# Patient Record
Sex: Female | Born: 1955 | Race: White | Hispanic: No | Marital: Married | Smoking: Never smoker
Health system: Southern US, Community
[De-identification: ages and names within clinical notes are randomized; demographics above are authoritative.]

## PROBLEM LIST (undated history)

## (undated) ENCOUNTER — Emergency Department (HOSPITAL_COMMUNITY): Discharge status: Absent without leave | Payer: Self-pay

## (undated) DIAGNOSIS — E785 Hyperlipidemia, unspecified: Secondary | ICD-10-CM

## (undated) DIAGNOSIS — D279 Benign neoplasm of unspecified ovary: Secondary | ICD-10-CM

## (undated) DIAGNOSIS — F419 Anxiety disorder, unspecified: Secondary | ICD-10-CM

## (undated) DIAGNOSIS — E213 Hyperparathyroidism, unspecified: Secondary | ICD-10-CM

## (undated) DIAGNOSIS — I10 Essential (primary) hypertension: Secondary | ICD-10-CM

## (undated) DIAGNOSIS — K219 Gastro-esophageal reflux disease without esophagitis: Secondary | ICD-10-CM

## (undated) HISTORY — PX: ABDOMINAL HYSTERECTOMY: SHX81

---

## 2004-06-29 ENCOUNTER — Ambulatory Visit: Payer: Self-pay | Admitting: Unknown Physician Specialty

## 2005-08-30 ENCOUNTER — Ambulatory Visit: Payer: Self-pay | Admitting: Unknown Physician Specialty

## 2006-01-17 ENCOUNTER — Ambulatory Visit: Payer: Self-pay

## 2006-08-05 ENCOUNTER — Emergency Department: Payer: Self-pay | Admitting: Emergency Medicine

## 2006-09-26 ENCOUNTER — Ambulatory Visit: Payer: Self-pay | Admitting: Unknown Physician Specialty

## 2007-09-30 ENCOUNTER — Ambulatory Visit: Payer: Self-pay | Admitting: Unknown Physician Specialty

## 2008-08-24 ENCOUNTER — Ambulatory Visit: Payer: Self-pay | Admitting: Gastroenterology

## 2008-10-05 ENCOUNTER — Ambulatory Visit: Payer: Self-pay | Admitting: Unknown Physician Specialty

## 2009-10-11 ENCOUNTER — Ambulatory Visit: Payer: Self-pay | Admitting: Unknown Physician Specialty

## 2010-10-31 ENCOUNTER — Ambulatory Visit: Payer: Self-pay | Admitting: Unknown Physician Specialty

## 2011-12-28 ENCOUNTER — Ambulatory Visit: Payer: Self-pay | Admitting: Unknown Physician Specialty

## 2016-02-08 ENCOUNTER — Other Ambulatory Visit: Payer: Self-pay | Admitting: Gastroenterology

## 2016-02-08 DIAGNOSIS — R1310 Dysphagia, unspecified: Secondary | ICD-10-CM

## 2016-02-17 ENCOUNTER — Ambulatory Visit
Admission: RE | Admit: 2016-02-17 | Discharge: 2016-02-17 | Disposition: A | Discharge status: Home / Self Care | Payer: Managed Care, Other (non HMO) | Source: Ambulatory Visit | Attending: Gastroenterology | Admitting: Gastroenterology

## 2016-02-17 DIAGNOSIS — K222 Esophageal obstruction: Secondary | ICD-10-CM | POA: Diagnosis not present

## 2016-02-17 DIAGNOSIS — R1310 Dysphagia, unspecified: Secondary | ICD-10-CM | POA: Diagnosis present

## 2016-02-17 DIAGNOSIS — K219 Gastro-esophageal reflux disease without esophagitis: Secondary | ICD-10-CM | POA: Insufficient documentation

## 2016-02-27 ENCOUNTER — Encounter: Payer: Self-pay | Admitting: *Deleted

## 2016-02-29 ENCOUNTER — Encounter
Admission: RE | Disposition: A | Discharge status: Home / Self Care | Payer: Self-pay | Source: Ambulatory Visit | Attending: Gastroenterology

## 2016-02-29 ENCOUNTER — Encounter: Payer: Self-pay | Admitting: Anesthesiology

## 2016-02-29 ENCOUNTER — Ambulatory Visit: Payer: Managed Care, Other (non HMO) | Admitting: Anesthesiology

## 2016-02-29 ENCOUNTER — Ambulatory Visit
Admission: RE | Admit: 2016-02-29 | Discharge: 2016-02-29 | Disposition: A | Discharge status: Home / Self Care | Payer: Managed Care, Other (non HMO) | Source: Ambulatory Visit | Attending: Gastroenterology | Admitting: Gastroenterology

## 2016-02-29 DIAGNOSIS — K222 Esophageal obstruction: Secondary | ICD-10-CM | POA: Insufficient documentation

## 2016-02-29 DIAGNOSIS — K449 Diaphragmatic hernia without obstruction or gangrene: Secondary | ICD-10-CM | POA: Diagnosis not present

## 2016-02-29 DIAGNOSIS — I10 Essential (primary) hypertension: Secondary | ICD-10-CM | POA: Insufficient documentation

## 2016-02-29 DIAGNOSIS — F419 Anxiety disorder, unspecified: Secondary | ICD-10-CM | POA: Insufficient documentation

## 2016-02-29 DIAGNOSIS — K210 Gastro-esophageal reflux disease with esophagitis: Secondary | ICD-10-CM | POA: Insufficient documentation

## 2016-02-29 DIAGNOSIS — Z79899 Other long term (current) drug therapy: Secondary | ICD-10-CM | POA: Diagnosis not present

## 2016-02-29 DIAGNOSIS — R1310 Dysphagia, unspecified: Secondary | ICD-10-CM | POA: Diagnosis present

## 2016-02-29 HISTORY — DX: Gastro-esophageal reflux disease without esophagitis: K21.9

## 2016-02-29 HISTORY — PX: ESOPHAGOGASTRODUODENOSCOPY (EGD) WITH PROPOFOL: SHX5813

## 2016-02-29 HISTORY — DX: Hyperlipidemia, unspecified: E78.5

## 2016-02-29 HISTORY — DX: Anxiety disorder, unspecified: F41.9

## 2016-02-29 HISTORY — DX: Essential (primary) hypertension: I10

## 2016-02-29 SURGERY — ESOPHAGOGASTRODUODENOSCOPY (EGD) WITH PROPOFOL
Anesthesia: General

## 2016-02-29 MED ORDER — PROPOFOL 500 MG/50ML IV EMUL
INTRAVENOUS | Status: DC | PRN
  Administered 2016-02-29: 150 ug/kg/min via INTRAVENOUS

## 2016-02-29 MED ORDER — SODIUM CHLORIDE 0.9 % IV SOLN
INTRAVENOUS | Status: DC
  Administered 2016-02-29: 1000 mL via INTRAVENOUS

## 2016-02-29 MED ORDER — LIDOCAINE HCL (CARDIAC) 20 MG/ML IV SOLN
INTRAVENOUS | Status: DC | PRN
  Administered 2016-02-29: 60 mg via INTRAVENOUS

## 2016-02-29 MED ORDER — MIDAZOLAM HCL 2 MG/2ML IJ SOLN
INTRAMUSCULAR | Status: DC | PRN
  Administered 2016-02-29: 2 mg via INTRAVENOUS

## 2016-02-29 MED ORDER — PROPOFOL 10 MG/ML IV BOLUS
INTRAVENOUS | Status: DC | PRN
  Administered 2016-02-29: 50 mg via INTRAVENOUS

## 2016-02-29 MED ORDER — ALFENTANIL 500 MCG/ML IJ INJ
INJECTION | INTRAMUSCULAR | Status: DC | PRN
  Administered 2016-02-29: 500 ug via INTRAVENOUS

## 2016-02-29 MED ORDER — SODIUM CHLORIDE 0.9 % IV SOLN: INTRAVENOUS | Status: DC

## 2016-02-29 MED ORDER — PHENYLEPHRINE HCL 10 MG/ML IJ SOLN
INTRAMUSCULAR | Status: DC | PRN
Start: 2016-02-29 — End: 2016-02-29
  Administered 2016-02-29: 200 ug via INTRAVENOUS
  Administered 2016-02-29: 100 ug via INTRAVENOUS

## 2016-02-29 NOTE — Anesthesia Postprocedure Evaluation (Signed)
Anesthesia Post Note  Patient: Roseanne KaufmanJudy I Woodhead  Procedure(s) Performed: Procedure(s): ESOPHAGOGASTRODUODENOSCOPY (EGD) WITH PROPOFOL  Patient location during evaluation: Endoscopy Anesthesia Type: General Level of consciousness: awake and alert Pain management: pain level controlled Vital Signs Assessment: post-procedure vital signs reviewed and stable Respiratory status: spontaneous breathing, nonlabored ventilation, respiratory function stable and patient connected to nasal cannula oxygen Cardiovascular status: blood pressure returned to baseline and stable Postop Assessment: no signs of nausea or vomiting Anesthetic complications: no    Last Vitals:  Filed Vitals:   02/29/16 1533 02/29/16 1543  BP: 108/81 115/79  Pulse: 91 89  Temp:    Resp: 15 16    Last Pain: There were no vitals filed for this visit.               Chaney Ingram S

## 2016-02-29 NOTE — Anesthesia Preprocedure Evaluation (Signed)
Anesthesia Evaluation  Patient identified by MRN, date of birth, ID band Patient awake    Reviewed: Allergy & Precautions, NPO status , Patient's Chart, lab work & pertinent test results, reviewed documented beta blocker date and time   Airway Mallampati: II  TM Distance: >3 FB     Dental  (+) Chipped   Pulmonary           Cardiovascular hypertension, Pt. on medications      Neuro/Psych Anxiety    GI/Hepatic GERD  ,  Endo/Other    Renal/GU      Musculoskeletal   Abdominal   Peds  Hematology   Anesthesia Other Findings   Reproductive/Obstetrics                             Anesthesia Physical Anesthesia Plan  ASA: II  Anesthesia Plan: General   Post-op Pain Management:    Induction:   Airway Management Planned: Nasal Cannula  Additional Equipment:   Intra-op Plan:   Post-operative Plan:   Informed Consent: I have reviewed the patients History and Physical, chart, labs and discussed the procedure including the risks, benefits and alternatives for the proposed anesthesia with the patient or authorized representative who has indicated his/her understanding and acceptance.     Plan Discussed with: CRNA  Anesthesia Plan Comments:         Anesthesia Quick Evaluation

## 2016-02-29 NOTE — Op Note (Signed)
[facility] Gastroenterology Patient Name: Tiffany Good Procedure Date: 02/29/2016 2:45 PM MRN: [id number] Account #: [id number] Date of Birth: 21-Apr-1956 Admit Type: Outpatient Age: 60 Room: [facility] ENDO ROOM 2 Gender: Female Note Status: Finalized Procedure:            Upper GI endoscopy Indications:          Dysphagia Providers:            Christena Deem, MD, Scot Jun, MD Referring MD:         Daniel Nones, MD (Referring MD) Medicines:            Monitored Anesthesia Care Complications:        No immediate complications. Procedure:            Pre-Anesthesia Assessment:                       - ASA Grade Assessment: II - A patient with mild                        systemic disease.                       After obtaining informed consent, the endoscope was                        passed under direct vision. Throughout the procedure,                        the patient's blood pressure, pulse, and oxygen                        saturations were monitored continuously. The Endoscope                        was introduced through the mouth, and advanced to the                        second part of duodenum. The upper GI endoscopy was                        accomplished without difficulty. The patient tolerated                        the procedure well. Findings:      A high-grade of narrowing Schatzki ring (acquired) was found at the       gastroesophageal junction.      LA Grade A (one or more mucosal breaks less than 5 mm, not extending       between tops of 2 mucosal folds) esophagitis with no bleeding was found.       Initially the ring would not allow passage of the scope. Assistance was       obtained from Dr Mechele Collin. Scope was passes into the stomach and on the       the duodenum. A guidewire was placed and the scope was withdrawn.       Dilation was performed with a Savary dilator with mild resistance at 38       Fr and 45 Fr .      The first portion of  the duodenum and second portion of the duodenum  were normal.      A small hiatal hernia was found.      The cardia and gastric fundus were normal on retroflexion otherwise.      The exam of the stomach was otherwise normal. Impression:           - High-grade of narrowing Schatzki ring.                       - LA Grade A erosive esophagitis. Dilated.                       - Normal first portion of the duodenum and second                        portion of the duodenum.                       - Small hiatal hernia.                       - No specimens collected. Recommendation:       - Discharge patient to home.                       - Clear liquid diet today.                       - Full liquid diet for 3 days, then advance as                        tolerated to soft diet for 3 days.                       - Use Protonix (pantoprazole) 40 mg PO daily daily.                       - Repeat upper endoscopy in 8 weeks to check healing                        and for retreatment. Procedure Code(s):    --- Professional ---                       [phone], Esophagogastroduodenoscopy, flexible, transoral;                        with insertion of guide wire followed by passage of                        dilator(s) through esophagus over guide wire Diagnosis Code(s):    --- Professional ---                       K22.2, Esophageal obstruction                       K20.8, Other esophagitis                       K44.9, Diaphragmatic hernia without obstruction or                        gangrene  R13.10, Dysphagia, unspecified CPT copyright 2016 American Medical Association. All rights reserved. The codes documented in this report are preliminary and upon coder review may  be revised to meet current compliance requirements. Christena DeemMartin U Doshie Maggi, MD 02/29/2016 3:12:27 PM This report has been signed electronically. Scot Junobert T Elliott, MD Number of Addenda: 0 Note Initiated On: 02/29/2016  2:45 PM      [facility]

## 2016-02-29 NOTE — Transfer of Care (Signed)
Immediate Anesthesia Transfer of Care Note  Patient: Tiffany Good  Procedure(s) Performed: Procedure(s): ESOPHAGOGASTRODUODENOSCOPY (EGD) WITH PROPOFOL  Patient Location: Endoscopy Unit  Anesthesia Type:General  Level of Consciousness: awake, alert , oriented and patient cooperative  Airway & Oxygen Therapy: Patient Spontanous Breathing and Patient connected to nasal cannula oxygen  Post-op Assessment: Report given to RN, Post -op Vital signs reviewed and stable and Patient moving all extremities X 4  Post vital signs: Reviewed and stable  Last Vitals:  Filed Vitals:   02/29/16 1335  BP: 160/83  Pulse: 116  Temp: 36.9 C  Resp: 18    Last Pain: There were no vitals filed for this visit.       Complications: No apparent anesthesia complications

## 2016-02-29 NOTE — H&P (Signed)
Outpatient short stay form Pre-procedure 02/29/2016 2:33 PM Christena DeemMartin U Skulskie MD  Primary Physician: Dr. Daniel NonesBert Klein  Reason for visit:  EGD  History of present illness:  Patient is a 60 year old female Sunday as above. She has a history of dysphagia that has been going on for several years but worsening over past couple of months. This occurs perhaps 2 or 3 times a week. She does had to regurgitate foods at times. He has been on proton pump inhibitor. She does take ibuprofen and occasionally before bedtime. She does have a history of gastric esophageal reflux. She denies use of any aspirin products or blood thinning agents. 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 Did not restrict passage of liquid barium. There are also evidence of intermittent tertiary contractions probably mild spasm as well as mild gastropathy reflux.    Current facility-administered medications:  .  0.9 %  sodium chloride infusion, , Intravenous, Continuous, Christena DeemMartin U Skulskie, MD .  0.9 %  sodium chloride infusion, , Intravenous, Continuous, Christena DeemMartin U Skulskie, MD, Last Rate: 20 mL/hr at 02/29/16 1409, 1,000 mL at 02/29/16 1409 .  0.9 %  sodium chloride infusion, , Intravenous, Continuous, Christena DeemMartin U Skulskie, MD  Prescriptions prior to admission  Medication Sig Dispense Refill Last Dose  . amLODipine (NORVASC) 5 MG tablet Take 5 mg by mouth daily.   02/29/2016 at 0545  . atorvastatin (LIPITOR) 20 MG tablet Take 20 mg by mouth daily.   02/28/2016 at 1830  . Cholecalciferol (VITAMIN D3) 5000 units TBDP Take 5,000 Units by mouth.     . escitalopram (LEXAPRO) 10 MG tablet Take 10 mg by mouth daily.     . pantoprazole (PROTONIX) 40 MG tablet Take 40 mg by mouth daily.   02/28/2016 at 1830     No Known Allergies   Past Medical History  Diagnosis Date  . Anxiety   . GERD (gastroesophageal reflux disease)    . Hypertension   . Serum lipids high     Review of systems:      Physical Exam    Heart and lungs: Regular rate and rhythm without rub or gallop, lungs are bilaterally clear.    HEENT: Normocephalic atraumatic eyes are anicteric    Other:     Pertinant exam for procedure: Soft nontender nondistended bowel sounds positive normoactive.    Planned proceedures: EGD and indicated procedures. I have discussed the risks benefits and complications of procedures to include not limited to bleeding, infection, perforation and the risk of sedation and the patient wishes to proceed.    Christena DeemMartin U Skulskie, MD Gastroenterology 02/29/2016  2:33 PM

## 2016-03-01 ENCOUNTER — Encounter: Payer: Self-pay | Admitting: Gastroenterology

## 2016-05-16 ENCOUNTER — Encounter: Payer: Self-pay | Admitting: *Deleted

## 2016-05-17 ENCOUNTER — Ambulatory Visit: Payer: Managed Care, Other (non HMO) | Admitting: Anesthesiology

## 2016-05-17 ENCOUNTER — Encounter: Payer: Self-pay | Admitting: *Deleted

## 2016-05-17 ENCOUNTER — Ambulatory Visit
Admission: RE | Admit: 2016-05-17 | Discharge: 2016-05-17 | Disposition: A | Discharge status: Home / Self Care | Payer: Managed Care, Other (non HMO) | Source: Ambulatory Visit | Attending: Gastroenterology | Admitting: Gastroenterology

## 2016-05-17 ENCOUNTER — Encounter
Admission: RE | Disposition: A | Discharge status: Home / Self Care | Payer: Self-pay | Source: Ambulatory Visit | Attending: Gastroenterology

## 2016-05-17 DIAGNOSIS — K222 Esophageal obstruction: Secondary | ICD-10-CM | POA: Insufficient documentation

## 2016-05-17 DIAGNOSIS — I10 Essential (primary) hypertension: Secondary | ICD-10-CM | POA: Insufficient documentation

## 2016-05-17 DIAGNOSIS — E785 Hyperlipidemia, unspecified: Secondary | ICD-10-CM | POA: Diagnosis not present

## 2016-05-17 DIAGNOSIS — Z79899 Other long term (current) drug therapy: Secondary | ICD-10-CM | POA: Insufficient documentation

## 2016-05-17 DIAGNOSIS — K2950 Unspecified chronic gastritis without bleeding: Secondary | ICD-10-CM | POA: Insufficient documentation

## 2016-05-17 DIAGNOSIS — K2980 Duodenitis without bleeding: Secondary | ICD-10-CM | POA: Insufficient documentation

## 2016-05-17 DIAGNOSIS — K219 Gastro-esophageal reflux disease without esophagitis: Secondary | ICD-10-CM | POA: Insufficient documentation

## 2016-05-17 DIAGNOSIS — K449 Diaphragmatic hernia without obstruction or gangrene: Secondary | ICD-10-CM | POA: Insufficient documentation

## 2016-05-17 DIAGNOSIS — R1310 Dysphagia, unspecified: Secondary | ICD-10-CM | POA: Insufficient documentation

## 2016-05-17 DIAGNOSIS — K224 Dyskinesia of esophagus: Secondary | ICD-10-CM | POA: Diagnosis not present

## 2016-05-17 HISTORY — PX: ESOPHAGOGASTRODUODENOSCOPY (EGD) WITH PROPOFOL: SHX5813

## 2016-05-17 SURGERY — ESOPHAGOGASTRODUODENOSCOPY (EGD) WITH PROPOFOL
Anesthesia: General

## 2016-05-17 MED ORDER — LIDOCAINE HCL (CARDIAC) 20 MG/ML IV SOLN
INTRAVENOUS | Status: DC | PRN
  Administered 2016-05-17: 40 mg via INTRAVENOUS

## 2016-05-17 MED ORDER — SODIUM CHLORIDE 0.9 % IV SOLN: INTRAVENOUS | Status: DC

## 2016-05-17 MED ORDER — SODIUM CHLORIDE 0.9 % IV SOLN
INTRAVENOUS | Status: DC
  Administered 2016-05-17: 1000 mL via INTRAVENOUS

## 2016-05-17 MED ORDER — PROPOFOL 10 MG/ML IV BOLUS
INTRAVENOUS | Status: DC | PRN
  Administered 2016-05-17: 100 mg via INTRAVENOUS

## 2016-05-17 MED ORDER — PROPOFOL 500 MG/50ML IV EMUL
INTRAVENOUS | Status: DC | PRN
  Administered 2016-05-17: 160 ug/kg/min via INTRAVENOUS

## 2016-05-17 MED ORDER — MIDAZOLAM HCL 2 MG/2ML IJ SOLN
INTRAMUSCULAR | Status: DC | PRN
  Administered 2016-05-17: 1 mg via INTRAVENOUS

## 2016-05-17 MED ORDER — FENTANYL CITRATE (PF) 100 MCG/2ML IJ SOLN
INTRAMUSCULAR | Status: DC | PRN
  Administered 2016-05-17: 50 ug via INTRAVENOUS

## 2016-05-17 NOTE — Op Note (Signed)
[facility] Gastroenterology Patient Name: Tiffany Good Procedure Date: 05/17/2016 11:53 AM MRN: [id number] Account #: [id number] Date of Birth: 1956/07/30 Admit Type: Outpatient Age: 60 Room: [facility] ENDO ROOM 4 Gender: Female Note Status: Finalized Procedure:            Upper GI endoscopy Indications:          Dysphagia Providers:            Christena Deem, MD Referring MD:         Daniel Nones, MD (Referring MD) Medicines:            Monitored Anesthesia Care Complications:        No immediate complications. Procedure:            Pre-Anesthesia Assessment:                       - ASA Grade Assessment: II - A patient with mild                        systemic disease.                       After obtaining informed consent, the endoscope was                        passed under direct vision. Throughout the procedure,                        the patient's blood pressure, pulse, and oxygen                        saturations were monitored continuously. The Endoscope                        was introduced through the mouth, and advanced to the                        second part of duodenum. The patient tolerated the                        procedure well. The upper GI endoscopy was accomplished                        without difficulty. Findings:      Abnormal motility was noted in the lower third of the esophagus. The       cricopharyngeus was normal. There is spasticity of the esophageal body.       Tertiary peristaltic waves are noted.      A low-grade of narrowing Schatzki ring (acquired) was found at the       gastroesophageal junction. A TTS dilator was passed through the scope.       Dilation with a 06-07-11 mm balloon dilator was performed to 12 mm, with       opening of the ring and blood noted.      A small to medium-sized hiatal hernia was found. The Z-line was a       variable distance from incisors; the hiatal hernia was sliding.      This was associated  with a Sheria Lang type lesion in the upper body of the       stomach.  Diffuse and patchy mild inflammation characterized by congestion (edema)       and erythema was found in the gastric body and in the gastric antrum.       Biopsies were taken with a cold forceps for histology. Biopsies were       taken with a cold forceps for Helicobacter pylori testing.      Patchy mild inflammation characterized by congestion (edema) and       granularity was found in the duodenal bulb. Impression:           - Abnormal esophageal motility.                       - Low-grade of narrowing Schatzki ring. Dilated.                       - Medium-sized hiatal hernia.                       - Erosive gastritis. Biopsied.                       - Duodenitis. Recommendation:       - Use Protonix (pantoprazole) 40 mg PO BID daily.                       - Return to GI clinic in 4 weeks.                       - Repeat upper endoscopy in 1 month to check healing                        and for retreatment.                       - Await pathology results. Procedure Code(s):    --- Professional ---                       [phone], Esophagogastroduodenoscopy, flexible, transoral;                        with transendoscopic balloon dilation of esophagus                        (less than 30 mm diameter)                       [id number], Esophagogastroduodenoscopy, flexible, transoral;                        with biopsy, single or multiple Diagnosis Code(s):    --- Professional ---                       K22.4, Dyskinesia of esophagus                       K22.2, Esophageal obstruction                       K44.9, Diaphragmatic hernia without obstruction or                        gangrene  K29.60, Other gastritis without bleeding                       K29.80, Duodenitis without bleeding                       R13.10, Dysphagia, unspecified CPT copyright 2016 American Medical Association. All rights  reserved. The codes documented in this report are preliminary and upon coder review may  be revised to meet current compliance requirements. Christena Deem, MD 05/17/2016 12:30:37 PM This report has been signed electronically. Number of Addenda: 0 Note Initiated On: 05/17/2016 11:53 AM      [facility]

## 2016-05-17 NOTE — H&P (Signed)
Outpatient short stay form Pre-procedure 05/17/2016 11:43 AM Christena DeemMartin U Ragnar Waas MD  Primary Physician: Dr. Daniel NonesBert Klein  Reason for visit:  EGD  History of present illness:  Patient is a 60 year old female presenting today for EGD. She had a procedure with dilatation on 02/29/2016 finding of a high-grade Schatzki ring at the gastroesophageal junction. This has improved her symptoms. She has had only a few episodes of dysphagia without need for regurgitating food since that time. She has been continuing take it proton pump inhibitor daily.    Current Facility-Administered Medications:  .  0.9 %  sodium chloride infusion, , Intravenous, Continuous, Christena DeemMartin U Tiann Saha, MD, Last Rate: 20 mL/hr at 05/17/16 1033, 1,000 mL at 05/17/16 1033 .  0.9 %  sodium chloride infusion, , Intravenous, Continuous, Christena DeemMartin U Ruhan Borak, MD  Prescriptions Prior to Admission  Medication Sig Dispense Refill Last Dose  . amLODipine (NORVASC) 5 MG tablet Take 5 mg by mouth daily.   05/17/2016 at 0830 time  . atorvastatin (LIPITOR) 20 MG tablet Take 20 mg by mouth daily.   05/16/2016 at Unknown time  . pantoprazole (PROTONIX) 40 MG tablet Take 40 mg by mouth daily.   05/16/2016 at Unknown time  . Cholecalciferol (VITAMIN D3) 5000 units TBDP Take 5,000 Units by mouth.   Not Taking at Unknown time  . escitalopram (LEXAPRO) 10 MG tablet Take 10 mg by mouth daily.   Not Taking at Unknown time     No Known Allergies   Past Medical History:  Diagnosis Date  . Anxiety   . GERD (gastroesophageal reflux disease)   . Hypertension   . Serum lipids high     Review of systems:      Physical Exam    Heart and lungs: Regular rate and rhythm without rub or gallop, lungs are bilaterally clear.    HEENT: Normocephalic atraumatic eyes are anicteric    Other:     Pertinant exam for procedure: Soft nontender nondistended bowel sounds positive normoactive.    Planned proceedures: EGD and indicated procedures.   I have  discussed the risks benefits and complications of procedures to include not limited to bleeding, infection, perforation and the risk of sedation and the patient wishes to proceed.   I have discussed the risks benefits and complications of procedures to include not limited to bleeding, infection, perforation and the risk of sedation and the patient wishes to proceed.

## 2016-05-17 NOTE — Anesthesia Preprocedure Evaluation (Signed)
Anesthesia Evaluation  Patient identified by MRN, date of birth, ID band Patient awake    Reviewed: Allergy & Precautions, NPO status , Patient's Chart, lab work & pertinent test results  History of Anesthesia Complications Negative for: history of anesthetic complications  Airway Mallampati: III       Dental  (+) Teeth Intact   Pulmonary neg pulmonary ROS,    breath sounds clear to auscultation       Cardiovascular Exercise Tolerance: Good hypertension, Pt. on medications  Rhythm:Regular     Neuro/Psych Anxiety negative neurological ROS     GI/Hepatic Neg liver ROS, GERD  Medicated,  Endo/Other  negative endocrine ROS  Renal/GU negative Renal ROS     Musculoskeletal negative musculoskeletal ROS (+)   Abdominal Normal abdominal exam  (+)   Peds negative pediatric ROS (+)  Hematology negative hematology ROS (+)   Anesthesia Other Findings   Reproductive/Obstetrics                             Anesthesia Physical Anesthesia Plan  ASA: II  Anesthesia Plan: General   Post-op Pain Management:    Induction: Intravenous  Airway Management Planned: Natural Airway and Nasal Cannula  Additional Equipment:   Intra-op Plan:   Post-operative Plan:   Informed Consent: I have reviewed the patients History and Physical, chart, labs and discussed the procedure including the risks, benefits and alternatives for the proposed anesthesia with the patient or authorized representative who has indicated his/her understanding and acceptance.     Plan Discussed with: CRNA  Anesthesia Plan Comments:         Anesthesia Quick Evaluation

## 2016-05-17 NOTE — Transfer of Care (Signed)
Immediate Anesthesia Transfer of Care Note  Patient: Tiffany Good  Procedure(s) Performed: Procedure(s): ESOPHAGOGASTRODUODENOSCOPY (EGD) WITH PROPOFOL (N/A)  Patient Location: PACU and Endoscopy Unit  Anesthesia Type:General  Level of Consciousness: awake, oriented and patient cooperative  Airway & Oxygen Therapy: Patient Spontanous Breathing and Patient connected to nasal cannula oxygen  Post-op Assessment: Report given to RN and Post -op Vital signs reviewed and stable  Post vital signs: Reviewed and stable  Last Vitals:  Vitals:   05/17/16 1010  BP: (!) 158/87  Pulse: 93  Resp: 18  Temp: 36.9 C    Last Pain:  Vitals:   05/17/16 1010  TempSrc: Tympanic         Complications: No apparent anesthesia complications

## 2016-05-17 NOTE — Anesthesia Postprocedure Evaluation (Signed)
Anesthesia Post Note  Patient: Tiffany Good  Procedure(s) Performed: Procedure(s) (LRB): ESOPHAGOGASTRODUODENOSCOPY (EGD) WITH PROPOFOL (N/A)  Patient location during evaluation: PACU Anesthesia Type: General Level of consciousness: awake Pain management: pain level controlled Vital Signs Assessment: post-procedure vital signs reviewed and stable Respiratory status: spontaneous breathing Cardiovascular status: stable Anesthetic complications: no    Last Vitals:  Vitals:   05/17/16 1248 05/17/16 1257  BP: 123/86 120/75  Pulse: 84 77  Resp: 16 (!) 21  Temp:      Last Pain:  Vitals:   05/17/16 1227  TempSrc: Tympanic                 VAN STAVEREN,Kelvon Giannini

## 2016-05-18 ENCOUNTER — Encounter: Payer: Self-pay | Admitting: Gastroenterology

## 2016-05-18 LAB — SURGICAL PATHOLOGY

## 2016-07-17 ENCOUNTER — Ambulatory Visit: Payer: Managed Care, Other (non HMO) | Admitting: Anesthesiology

## 2016-07-17 ENCOUNTER — Encounter
Admission: RE | Disposition: A | Discharge status: Home / Self Care | Payer: Self-pay | Source: Ambulatory Visit | Attending: Gastroenterology

## 2016-07-17 ENCOUNTER — Ambulatory Visit
Admission: RE | Admit: 2016-07-17 | Discharge: 2016-07-17 | Disposition: A | Discharge status: Home / Self Care | Payer: Managed Care, Other (non HMO) | Source: Ambulatory Visit | Attending: Gastroenterology | Admitting: Gastroenterology

## 2016-07-17 DIAGNOSIS — I10 Essential (primary) hypertension: Secondary | ICD-10-CM | POA: Insufficient documentation

## 2016-07-17 DIAGNOSIS — K449 Diaphragmatic hernia without obstruction or gangrene: Secondary | ICD-10-CM | POA: Insufficient documentation

## 2016-07-17 DIAGNOSIS — R1310 Dysphagia, unspecified: Secondary | ICD-10-CM | POA: Diagnosis present

## 2016-07-17 DIAGNOSIS — F419 Anxiety disorder, unspecified: Secondary | ICD-10-CM | POA: Diagnosis not present

## 2016-07-17 DIAGNOSIS — K219 Gastro-esophageal reflux disease without esophagitis: Secondary | ICD-10-CM | POA: Diagnosis not present

## 2016-07-17 DIAGNOSIS — K222 Esophageal obstruction: Secondary | ICD-10-CM | POA: Diagnosis not present

## 2016-07-17 HISTORY — PX: ESOPHAGOGASTRODUODENOSCOPY (EGD) WITH PROPOFOL: SHX5813

## 2016-07-17 SURGERY — ESOPHAGOGASTRODUODENOSCOPY (EGD) WITH PROPOFOL
Anesthesia: General

## 2016-07-17 MED ORDER — FENTANYL CITRATE (PF) 100 MCG/2ML IJ SOLN
INTRAMUSCULAR | Status: DC | PRN
  Administered 2016-07-17: 50 ug via INTRAVENOUS

## 2016-07-17 MED ORDER — MIDAZOLAM HCL 2 MG/2ML IJ SOLN
INTRAMUSCULAR | Status: DC | PRN
  Administered 2016-07-17: 2 mg via INTRAVENOUS

## 2016-07-17 MED ORDER — LIDOCAINE HCL (CARDIAC) 20 MG/ML IV SOLN
INTRAVENOUS | Status: DC | PRN
  Administered 2016-07-17: 100 mg via INTRAVENOUS

## 2016-07-17 MED ORDER — PROPOFOL 10 MG/ML IV BOLUS
INTRAVENOUS | Status: DC | PRN
  Administered 2016-07-17 (×3): 20 mg via INTRAVENOUS
  Administered 2016-07-17: 50 mg via INTRAVENOUS
  Administered 2016-07-17: 30 mg via INTRAVENOUS
  Administered 2016-07-17: 20 mg via INTRAVENOUS

## 2016-07-17 MED ORDER — SODIUM CHLORIDE 0.9 % IV SOLN
INTRAVENOUS | Status: DC
  Administered 2016-07-17: 14:00:00 via INTRAVENOUS
  Administered 2016-07-17: 1000 mL via INTRAVENOUS

## 2016-07-17 MED ORDER — PROPOFOL 500 MG/50ML IV EMUL
INTRAVENOUS | Status: DC | PRN
  Administered 2016-07-17: 160 ug/kg/min via INTRAVENOUS

## 2016-07-17 MED ORDER — SODIUM CHLORIDE 0.9 % IV SOLN: INTRAVENOUS | Status: DC

## 2016-07-17 NOTE — Transfer of Care (Signed)
Immediate Anesthesia Transfer of Care Note  Patient: Tiffany Good  Procedure(s) Performed: Procedure(s): ESOPHAGOGASTRODUODENOSCOPY (EGD) WITH PROPOFOL (N/A)  Patient Location: PACU  Anesthesia Type:General  Level of Consciousness: awake  Airway & Oxygen Therapy: Patient Spontanous Breathing and Patient connected to nasal cannula oxygen  Post-op Assessment: Report given to RN and Post -op Vital signs reviewed and stable  Post vital signs: Reviewed and stable  Last Vitals:  Vitals:   07/17/16 1213 07/17/16 1413  BP: (!) 159/74 127/87  Pulse: (!) 103 98  Resp: 16 16  Temp: 37.5 C (!) 36 C    Last Pain:  Vitals:   07/17/16 1413  TempSrc: Tympanic         Complications: No apparent anesthesia complications

## 2016-07-17 NOTE — Op Note (Signed)
[facility] Gastroenterology Patient Name: Tiffany SaltsJudy Heberlein Procedure Date: 07/17/2016 1:36 PM MRN: [id number] Account #: [id number] Date of Birth: 08/23/1956 Admit Type: Outpatient Age: [age] Room[facility] ENDO ROOM 3 Gender: Female Note Status: Finalized Procedure:            Upper GI endoscopy Indications:          Dysphagia Providers:            Christena DeemMartin U. Skulskie, MD Referring MD:         Daniel NonesBert Klein, MD (Referring MD) Medicines:            Monitored Anesthesia Care Complications:        No immediate complications. Procedure:            Pre-Anesthesia Assessment:                       - ASA Grade Assessment: II - A patient with mild                        systemic disease.                       After obtaining informed consent, the endoscope was                        passed under direct vision. Throughout the procedure,                        the patient's blood pressure, pulse, and oxygen                        saturations were monitored continuously. The Endoscope                        was introduced through the mouth, and advanced to the                        third part of duodenum. The upper GI endoscopy was                        accomplished without difficulty. The patient tolerated                        the procedure well. Findings:      The lower third of the esophagus was moderately tortuous.      A medium-sized hiatal hernia was present.      The entire examined stomach was normal.      The examined duodenum was normal.      A low-grade of narrowing and non-obstructing Schatzki ring (acquired)       was found at the gastroesophageal junction. A TTS dilator was passed       through the scope. Dilation with a 12-13.5-15 mm balloon dilator was       performed to 14.5 mm, with opening of the ring.      The cardia and gastric fundus were normal on retroflexion otherwise. Impression:           - Tortuous esophagus.                       - Medium-sized hiatal  hernia.                       -  Normal stomach.                       - Normal examined duodenum.                       - Low-grade of narrowing and non-obstructing Schatzki                        ring. Dilated.                       - No specimens collected. Recommendation:       - Full liquid diet for 2 days, then advance as                        tolerated to soft diet for 3 days.                       - Use Protonix (pantoprazole) 40 mg PO daily daily. Procedure Code(s):    --- Professional ---                       [phone], Esophagogastroduodenoscopy, flexible, transoral;                        with transendoscopic balloon dilation of esophagus                        (less than 30 mm diameter) Diagnosis Code(s):    --- Professional ---                       Q39.9, Congenital malformation of esophagus, unspecified                       K44.9, Diaphragmatic hernia without obstruction or                        gangrene                       K22.2, Esophageal obstruction                       R13.10, Dysphagia, unspecified CPT copyright 2016 American Medical Association. All rights reserved. The codes documented in this report are preliminary and upon coder review may  be revised to meet current compliance requirements. Christena DeemMartin U Skulskie, MD 07/17/2016 2:12:17 PM This report has been signed electronically. Number of Addenda: 0 Note Initiated On: 07/17/2016 1:36 PM      [facility]

## 2016-07-17 NOTE — Anesthesia Preprocedure Evaluation (Signed)
Anesthesia Evaluation  Patient identified by MRN, date of birth, ID band Patient awake    Reviewed: Allergy & Precautions, NPO status , Patient's Chart, lab work & pertinent test results  Airway Mallampati: III       Dental  (+) Teeth Intact   Pulmonary neg pulmonary ROS,    breath sounds clear to auscultation       Cardiovascular Exercise Tolerance: Good hypertension, Pt. on medications  Rhythm:Regular Rate:Normal     Neuro/Psych Anxiety    GI/Hepatic Neg liver ROS, GERD  Medicated,  Endo/Other  negative endocrine ROS  Renal/GU negative Renal ROS     Musculoskeletal negative musculoskeletal ROS (+)   Abdominal (+) + obese,   Peds negative pediatric ROS (+)  Hematology negative hematology ROS (+)   Anesthesia Other Findings   Reproductive/Obstetrics negative OB ROS                             Anesthesia Physical Anesthesia Plan  ASA: II  Anesthesia Plan: General   Post-op Pain Management:    Induction: Intravenous  Airway Management Planned: Natural Airway and Nasal Cannula  Additional Equipment:   Intra-op Plan:   Post-operative Plan:   Informed Consent: I have reviewed the patients History and Physical, chart, labs and discussed the procedure including the risks, benefits and alternatives for the proposed anesthesia with the patient or authorized representative who has indicated his/her understanding and acceptance.     Plan Discussed with: CRNA  Anesthesia Plan Comments:         Anesthesia Quick Evaluation

## 2016-07-17 NOTE — H&P (Signed)
Outpatient short stay form Pre-procedure 07/17/2016 1:30 PM Christena DeemMartin U Skulskie MD  Primary Physician: Dr. Daniel NonesBert Klein  Reason for visit:  EGD  History of present illness:  Patient is a 60 year old female presenting today as above. She underwent EGD with esophageal balloon dilatation on 05/17/2016. At that time there is a low-grade narrowing noted and dilatation was accomplished with opening of the ring at 12 mm. Since that time her symptoms have improved however she did have 1 episode of food sticking and she does have frequent reflux issues. She is taking a daily PPI. She takes no aspirin products or blood thinning agents.    Current Facility-Administered Medications:  .  0.9 %  sodium chloride infusion, , Intravenous, Continuous, Christena DeemMartin U Skulskie, MD, Last Rate: 50 mL/hr at 07/17/16 1225, 1,000 mL at 07/17/16 1225 .  0.9 %  sodium chloride infusion, , Intravenous, Continuous, Christena DeemMartin U Skulskie, MD  Prescriptions Prior to Admission  Medication Sig Dispense Refill Last Dose  . amLODipine (NORVASC) 5 MG tablet Take 5 mg by mouth daily.   07/16/2016 at 0630  . atorvastatin (LIPITOR) 20 MG tablet Take 20 mg by mouth daily.   07/16/2016 at Unknown time  . escitalopram (LEXAPRO) 10 MG tablet Take 10 mg by mouth daily.   07/16/2016 at Unknown time  . pantoprazole (PROTONIX) 40 MG tablet Take 40 mg by mouth daily.   07/16/2016 at 0630  . Cholecalciferol (VITAMIN D3) 5000 units TBDP Take 5,000 Units by mouth.   Not Taking at Unknown time     No Known Allergies   Past Medical History:  Diagnosis Date  . Anxiety   . GERD (gastroesophageal reflux disease)   . Hypertension   . Serum lipids high     Review of systems:      Physical Exam    Heart and lungs: Regular rate and rhythm without rub or gallop, lungs are bilaterally clear.    HEENT: Normocephalic atraumatic eyes are anicteric    Other:     Pertinant exam for procedure: Soft nontender nondistended bowel sounds positive  normoactive.    Planned proceedures: EGD and indicated procedures. I have discussed the risks benefits and complications of procedures to include not limited to bleeding, infection, perforation and the risk of sedation and the patient wishes to proceed.    Christena DeemMartin U Skulskie, MD Gastroenterology 07/17/2016  1:30 PM

## 2016-07-18 ENCOUNTER — Encounter: Payer: Self-pay | Admitting: Gastroenterology

## 2016-07-25 NOTE — Anesthesia Postprocedure Evaluation (Signed)
Anesthesia Post Note  Patient: Tiffany Good  Procedure(s) Performed: Procedure(s) (LRB): ESOPHAGOGASTRODUODENOSCOPY (EGD) WITH PROPOFOL (N/A)  Patient location during evaluation: PACU Anesthesia Type: General Level of consciousness: awake Pain management: pain level controlled Vital Signs Assessment: post-procedure vital signs reviewed and stable Respiratory status: spontaneous breathing Cardiovascular status: stable Anesthetic complications: no    Last Vitals:  Vitals:   07/17/16 1413 07/17/16 1443  BP: 127/87 (!) 119/98  Pulse: 98   Resp: 16   Temp: (!) 36 C     Last Pain:  Vitals:   07/18/16 0739  TempSrc:   PainSc: 0-No pain                 VAN STAVEREN,[name]

## 2016-09-21 ENCOUNTER — Other Ambulatory Visit: Payer: Self-pay | Admitting: Internal Medicine

## 2016-09-21 DIAGNOSIS — Z1231 Encounter for screening mammogram for malignant neoplasm of breast: Secondary | ICD-10-CM

## 2016-10-26 ENCOUNTER — Ambulatory Visit
Admission: RE | Admit: 2016-10-26 | Discharge: 2016-10-26 | Disposition: A | Discharge status: Home / Self Care | Payer: Managed Care, Other (non HMO) | Source: Ambulatory Visit | Attending: Internal Medicine | Admitting: Internal Medicine

## 2016-10-26 DIAGNOSIS — Z1231 Encounter for screening mammogram for malignant neoplasm of breast: Secondary | ICD-10-CM | POA: Diagnosis present

## 2016-11-07 ENCOUNTER — Inpatient Hospital Stay
Admission: RE | Admit: 2016-11-07 | Discharge: 2016-11-07 | Disposition: A | Discharge status: Home / Self Care | Payer: Self-pay | Source: Ambulatory Visit | Attending: *Deleted | Admitting: *Deleted

## 2016-11-07 ENCOUNTER — Other Ambulatory Visit: Payer: Self-pay | Admitting: *Deleted

## 2016-11-07 DIAGNOSIS — Z9289 Personal history of other medical treatment: Secondary | ICD-10-CM

## 2017-09-26 ENCOUNTER — Other Ambulatory Visit: Payer: Self-pay | Admitting: Obstetrics and Gynecology

## 2017-09-26 DIAGNOSIS — Z1231 Encounter for screening mammogram for malignant neoplasm of breast: Secondary | ICD-10-CM

## 2017-11-01 ENCOUNTER — Ambulatory Visit
Admission: RE | Admit: 2017-11-01 | Discharge: 2017-11-01 | Disposition: A | Discharge status: Home / Self Care | Payer: 59 | Source: Ambulatory Visit | Attending: Obstetrics and Gynecology | Admitting: Obstetrics and Gynecology

## 2017-11-01 DIAGNOSIS — Z1231 Encounter for screening mammogram for malignant neoplasm of breast: Secondary | ICD-10-CM

## 2018-10-24 ENCOUNTER — Other Ambulatory Visit: Payer: Self-pay | Admitting: Internal Medicine

## 2018-10-24 DIAGNOSIS — Z1231 Encounter for screening mammogram for malignant neoplasm of breast: Secondary | ICD-10-CM

## 2018-11-14 ENCOUNTER — Ambulatory Visit
Admission: RE | Admit: 2018-11-14 | Discharge: 2018-11-14 | Disposition: A | Discharge status: Home / Self Care | Payer: 59 | Source: Ambulatory Visit | Attending: Obstetrics and Gynecology | Admitting: Obstetrics and Gynecology

## 2018-11-14 ENCOUNTER — Other Ambulatory Visit: Payer: Self-pay | Admitting: Obstetrics and Gynecology

## 2018-11-14 ENCOUNTER — Other Ambulatory Visit: Payer: Self-pay

## 2018-11-14 ENCOUNTER — Other Ambulatory Visit (HOSPITAL_COMMUNITY): Payer: Self-pay | Admitting: Obstetrics and Gynecology

## 2018-11-14 DIAGNOSIS — R1900 Intra-abdominal and pelvic swelling, mass and lump, unspecified site: Secondary | ICD-10-CM | POA: Diagnosis present

## 2018-11-14 MED ORDER — IOHEXOL 300 MG/ML  SOLN
100.0000 mL | Freq: Once | INTRAMUSCULAR | Status: AC | PRN
  Administered 2018-11-14: 100 mL via INTRAVENOUS

## 2018-11-18 ENCOUNTER — Telehealth: Payer: Self-pay

## 2018-11-18 NOTE — Telephone Encounter (Signed)
Referral received from Dr. Dalbert Garnet for large complex adnexal mass. CA [phone]. I have contacted Ms. Tiffany Good and she will have an appointment arranged at [facility] with Gyn Onc. Awaiting callback for appointment details.

## 2018-11-20 NOTE — Progress Notes (Signed)
Ms. Delmas has appointment at [facility] today with Dr. Cherylynn Ridges. Old records reviewed for requested pathology from 2001 hysterectomy. Unable to locate in [facility] after review. Spoke with pathology. They were unable to locate in their archived system. Operative note from 10-31-1999 located and sent to [facility].

## 2019-03-09 ENCOUNTER — Other Ambulatory Visit: Payer: Self-pay | Admitting: Internal Medicine

## 2019-03-09 DIAGNOSIS — M545 Low back pain, unspecified: Secondary | ICD-10-CM

## 2019-03-19 ENCOUNTER — Ambulatory Visit
Admission: RE | Admit: 2019-03-19 | Discharge: 2019-03-19 | Disposition: A | Discharge status: Home / Self Care | Payer: 59 | Source: Ambulatory Visit | Attending: Internal Medicine | Admitting: Internal Medicine

## 2019-03-19 ENCOUNTER — Other Ambulatory Visit: Payer: Self-pay

## 2019-03-19 DIAGNOSIS — M545 Low back pain, unspecified: Secondary | ICD-10-CM

## 2019-05-29 ENCOUNTER — Ambulatory Visit
Admission: RE | Admit: 2019-05-29 | Discharge: 2019-05-29 | Disposition: A | Discharge status: Home / Self Care | Payer: 59 | Source: Ambulatory Visit | Attending: Internal Medicine | Admitting: Internal Medicine

## 2019-05-29 DIAGNOSIS — Z1231 Encounter for screening mammogram for malignant neoplasm of breast: Secondary | ICD-10-CM | POA: Diagnosis not present

## 2019-06-04 ENCOUNTER — Other Ambulatory Visit: Payer: Self-pay | Admitting: Internal Medicine

## 2019-06-04 DIAGNOSIS — R921 Mammographic calcification found on diagnostic imaging of breast: Secondary | ICD-10-CM

## 2019-06-04 DIAGNOSIS — R928 Other abnormal and inconclusive findings on diagnostic imaging of breast: Secondary | ICD-10-CM

## 2019-06-10 ENCOUNTER — Ambulatory Visit
Admission: RE | Admit: 2019-06-10 | Discharge: 2019-06-10 | Disposition: A | Discharge status: Home / Self Care | Payer: 59 | Source: Ambulatory Visit | Attending: Internal Medicine | Admitting: Internal Medicine

## 2019-06-10 DIAGNOSIS — R921 Mammographic calcification found on diagnostic imaging of breast: Secondary | ICD-10-CM

## 2019-06-10 DIAGNOSIS — R928 Other abnormal and inconclusive findings on diagnostic imaging of breast: Secondary | ICD-10-CM | POA: Diagnosis not present

## 2019-06-15 ENCOUNTER — Other Ambulatory Visit: Payer: Self-pay | Admitting: Internal Medicine

## 2019-06-15 DIAGNOSIS — R921 Mammographic calcification found on diagnostic imaging of breast: Secondary | ICD-10-CM

## 2019-12-10 ENCOUNTER — Ambulatory Visit
Admission: RE | Admit: 2019-12-10 | Discharge: 2019-12-10 | Disposition: A | Discharge status: Home / Self Care | Payer: 59 | Source: Ambulatory Visit | Attending: Internal Medicine | Admitting: Internal Medicine

## 2019-12-10 DIAGNOSIS — R921 Mammographic calcification found on diagnostic imaging of breast: Secondary | ICD-10-CM | POA: Insufficient documentation

## 2019-12-14 ENCOUNTER — Other Ambulatory Visit: Payer: Self-pay | Admitting: Internal Medicine

## 2019-12-14 DIAGNOSIS — R928 Other abnormal and inconclusive findings on diagnostic imaging of breast: Secondary | ICD-10-CM

## 2019-12-14 DIAGNOSIS — R921 Mammographic calcification found on diagnostic imaging of breast: Secondary | ICD-10-CM

## 2020-07-15 ENCOUNTER — Other Ambulatory Visit: Payer: Self-pay

## 2020-07-15 ENCOUNTER — Ambulatory Visit
Admission: RE | Admit: 2020-07-15 | Discharge: 2020-07-15 | Disposition: A | Discharge status: Home / Self Care | Payer: 59 | Source: Ambulatory Visit | Attending: Internal Medicine | Admitting: Internal Medicine

## 2020-07-15 DIAGNOSIS — R921 Mammographic calcification found on diagnostic imaging of breast: Secondary | ICD-10-CM | POA: Insufficient documentation

## 2020-07-15 DIAGNOSIS — R928 Other abnormal and inconclusive findings on diagnostic imaging of breast: Secondary | ICD-10-CM | POA: Diagnosis present

## 2020-07-26 IMAGING — MG MM DIGITAL SCREENING BILAT W/ TOMO W/ CAD
8 series · 8 of 24 positions shown · non-contrast
Comparison: Previous exam(s).

CLINICAL DATA: Screening.

EXAM:
DIGITAL SCREENING BILATERAL MAMMOGRAM WITH TOMO AND CAD

[L CC synth-2D]
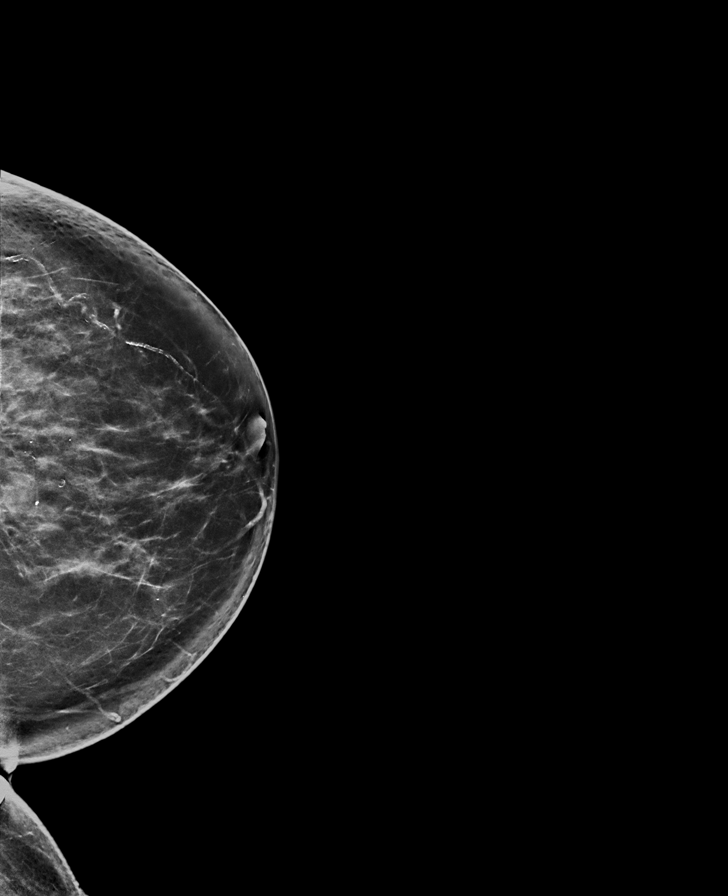

[R CC synth-2D]
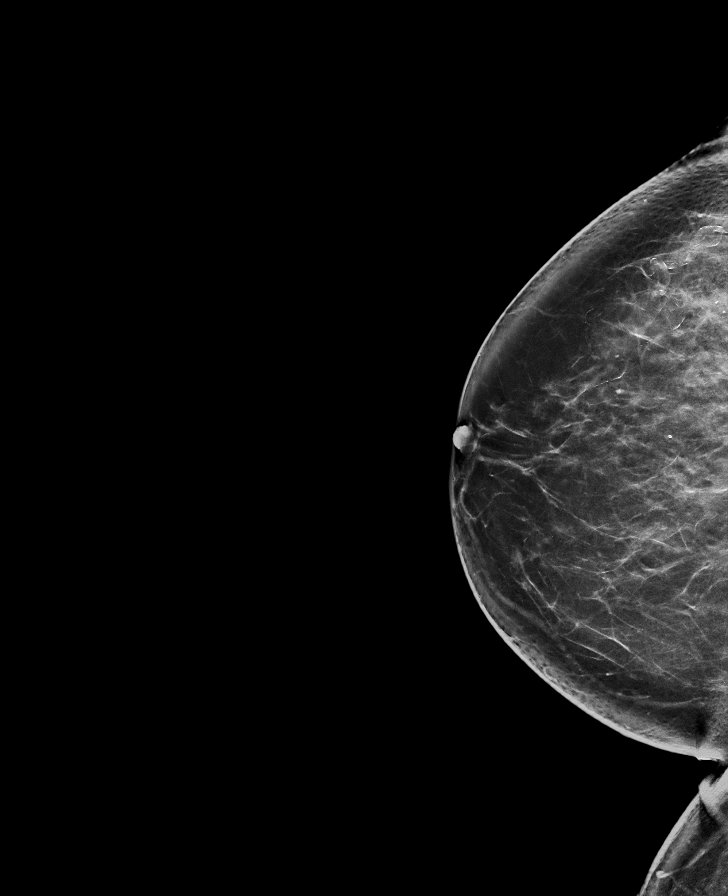

[R MLO synth-2D]
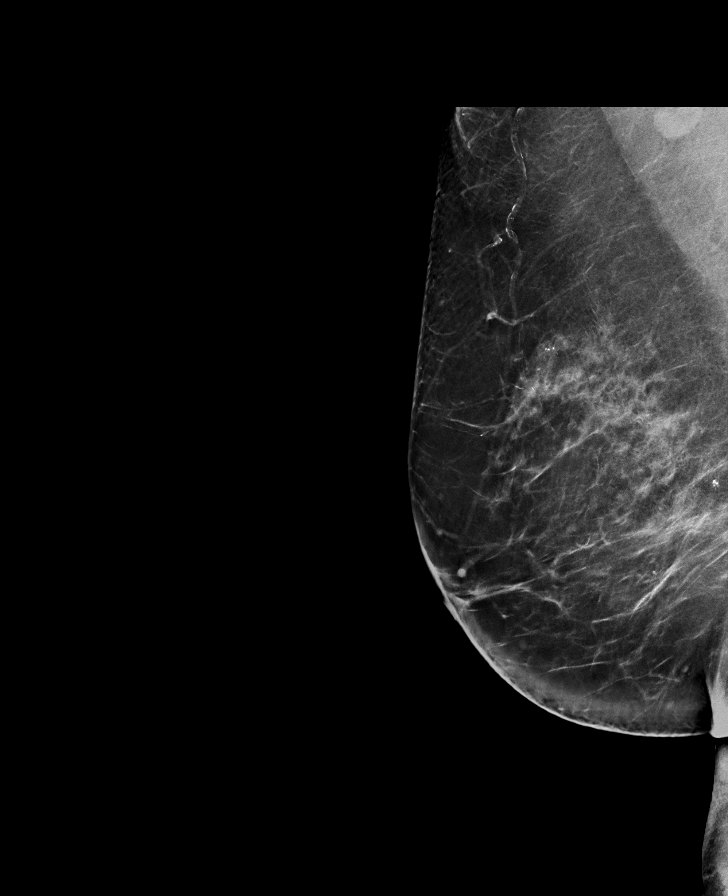

[L MLO synth-2D]
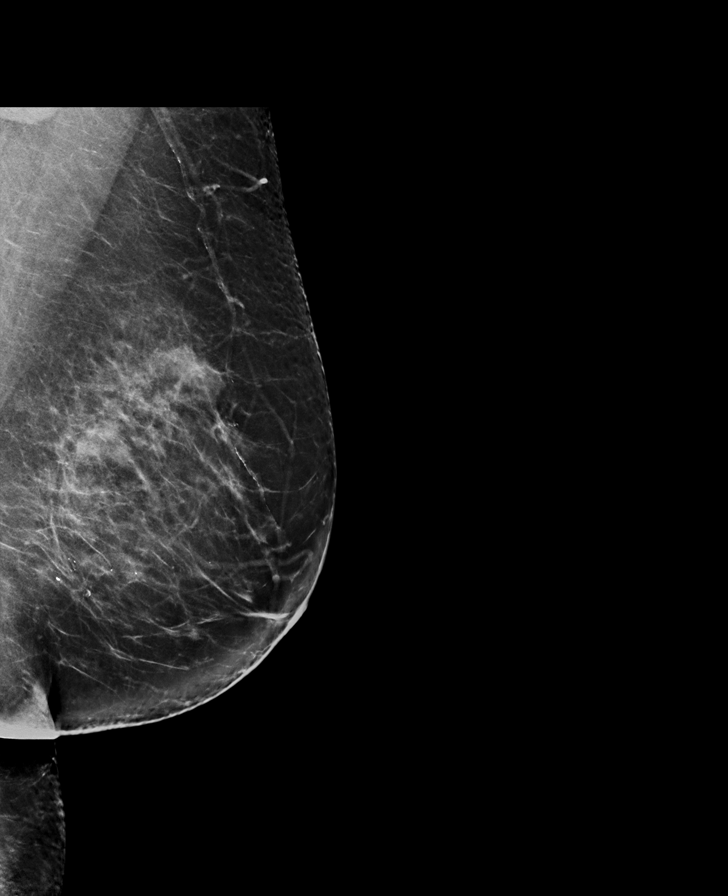

[R MLO tomo · tomo slice 47/92.0]
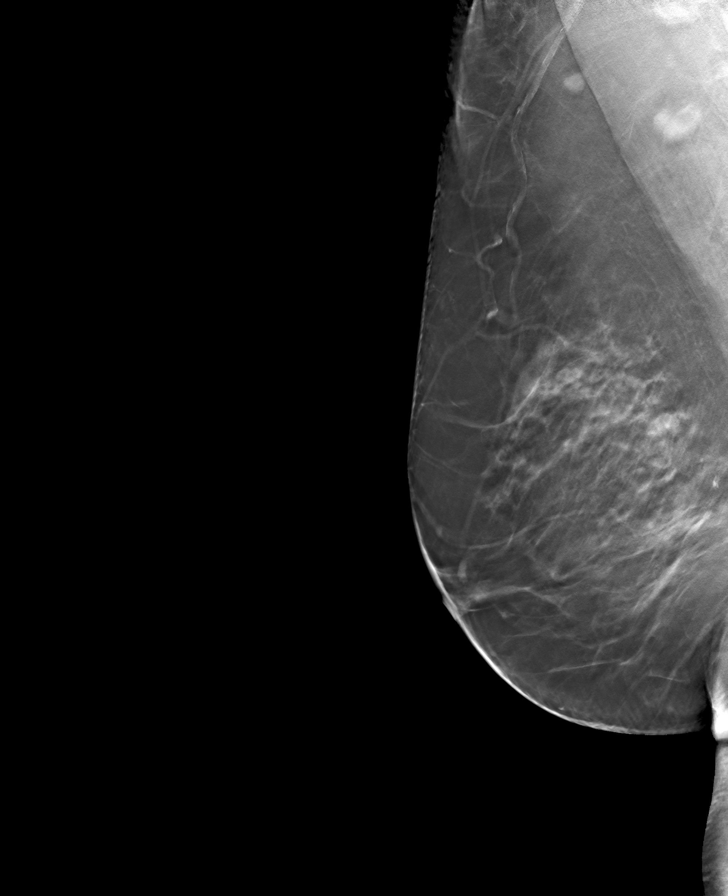

[L MLO tomo · tomo slice 47/92.0]
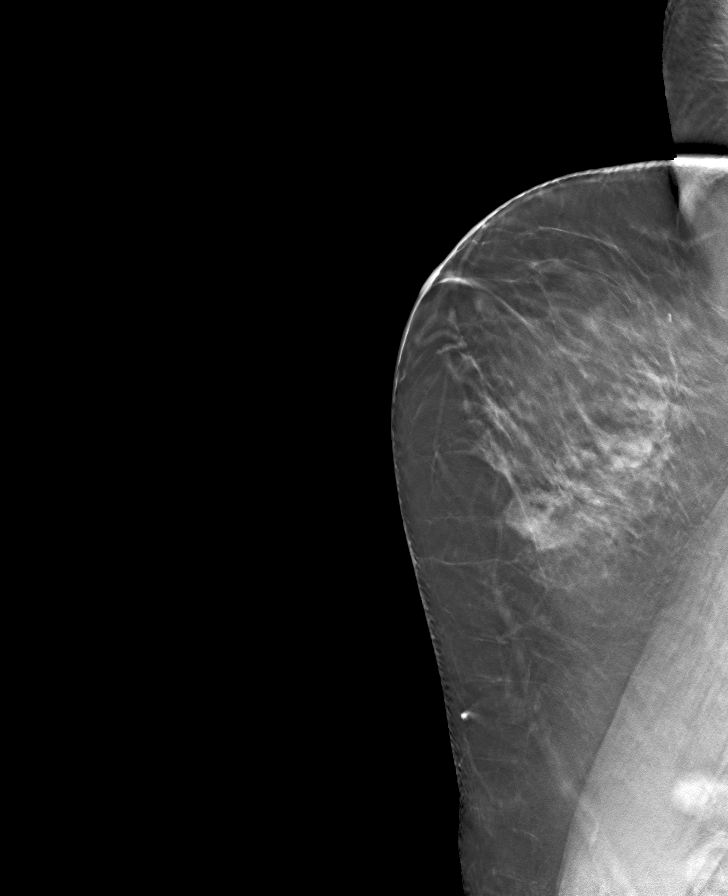

[L CC tomo · tomo slice 41/82.0]
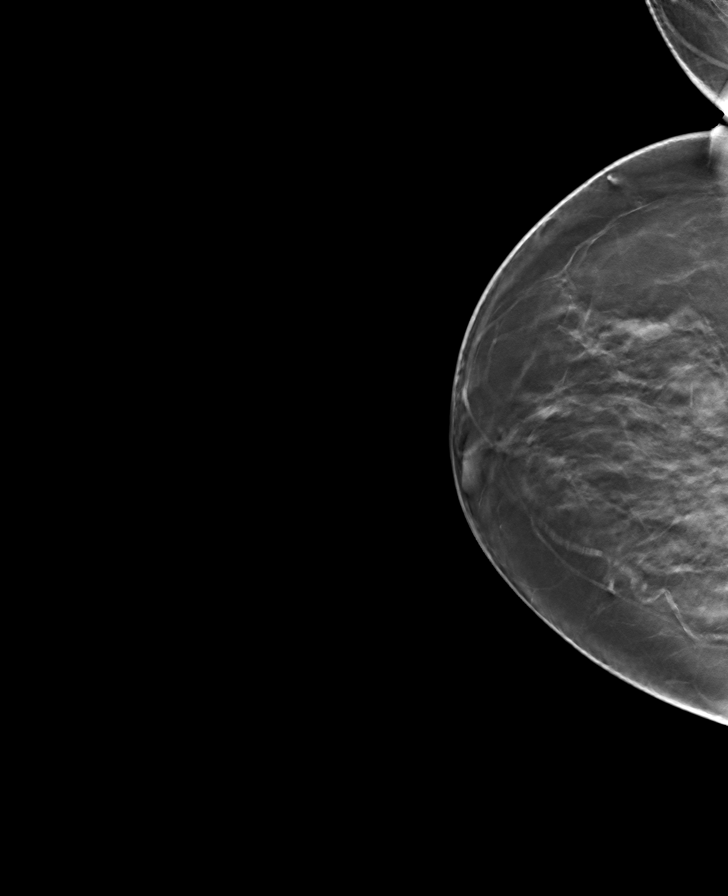

[R CC tomo · tomo slice 43/85.0]
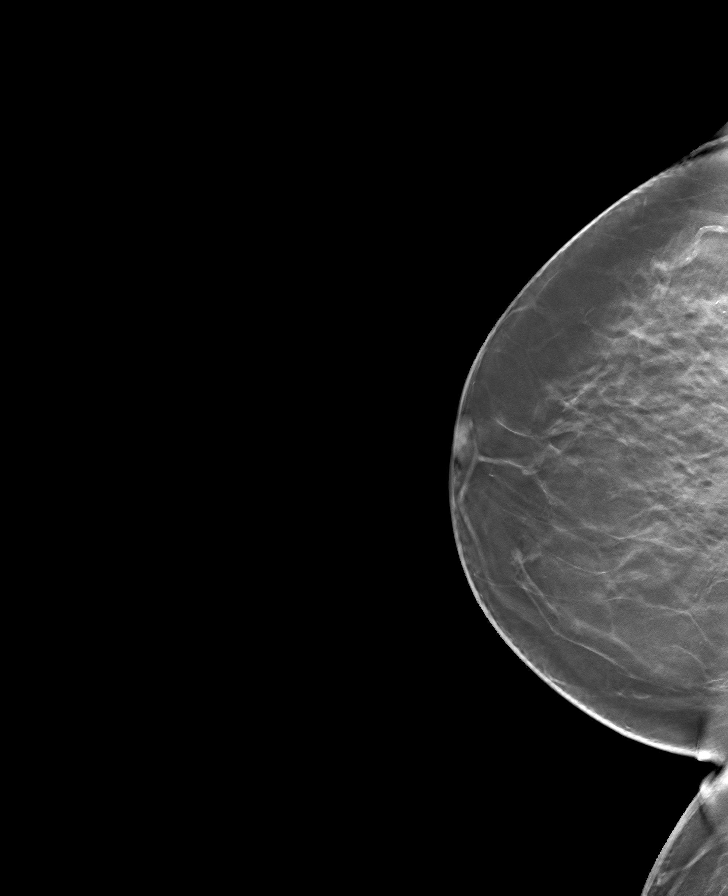

[8 of 24 positions shown; findings below may reference images not displayed]

ACR Breast Density Category c: The breast tissue is heterogeneously
dense, which may obscure small masses.
FINDINGS: In the right breast, calcifications warrant further evaluation with
magnified views. In the left breast, no findings suspicious for
malignancy. Images were processed with CAD.
IMPRESSION: Further evaluation is suggested for calcifications in the right
breast.

RECOMMENDATION:
Diagnostic mammogram of the right breast. (Code:[id number])

The patient will be contacted regarding the findings, and additional
imaging will be scheduled.

BI-RADS CATEGORY  0: Incomplete. Need additional imaging evaluation
and/or prior mammograms for comparison.

## 2021-06-27 ENCOUNTER — Other Ambulatory Visit: Payer: Self-pay | Admitting: Internal Medicine

## 2021-06-27 DIAGNOSIS — R921 Mammographic calcification found on diagnostic imaging of breast: Secondary | ICD-10-CM

## 2021-07-24 ENCOUNTER — Other Ambulatory Visit: Payer: Self-pay

## 2021-07-24 ENCOUNTER — Ambulatory Visit
Admission: RE | Admit: 2021-07-24 | Discharge: 2021-07-24 | Disposition: A | Discharge status: Home / Self Care | Payer: 59 | Source: Ambulatory Visit | Attending: Internal Medicine | Admitting: Internal Medicine

## 2021-07-24 DIAGNOSIS — R921 Mammographic calcification found on diagnostic imaging of breast: Secondary | ICD-10-CM | POA: Diagnosis not present

## 2021-07-25 ENCOUNTER — Other Ambulatory Visit: Payer: Self-pay | Admitting: Internal Medicine

## 2021-07-25 DIAGNOSIS — R921 Mammographic calcification found on diagnostic imaging of breast: Secondary | ICD-10-CM

## 2021-07-25 DIAGNOSIS — R928 Other abnormal and inconclusive findings on diagnostic imaging of breast: Secondary | ICD-10-CM

## 2021-08-08 ENCOUNTER — Other Ambulatory Visit: Payer: Self-pay

## 2021-08-08 ENCOUNTER — Ambulatory Visit
Admission: RE | Admit: 2021-08-08 | Discharge: 2021-08-08 | Disposition: A | Discharge status: Home / Self Care | Payer: 59 | Source: Ambulatory Visit | Attending: Internal Medicine | Admitting: Internal Medicine

## 2021-08-08 DIAGNOSIS — R928 Other abnormal and inconclusive findings on diagnostic imaging of breast: Secondary | ICD-10-CM | POA: Insufficient documentation

## 2021-08-08 DIAGNOSIS — R921 Mammographic calcification found on diagnostic imaging of breast: Secondary | ICD-10-CM

## 2021-08-08 HISTORY — PX: BREAST BIOPSY: SHX20

## 2021-08-09 LAB — SURGICAL PATHOLOGY

## 2022-09-03 ENCOUNTER — Other Ambulatory Visit: Payer: Self-pay | Admitting: Internal Medicine

## 2022-09-03 DIAGNOSIS — Z1231 Encounter for screening mammogram for malignant neoplasm of breast: Secondary | ICD-10-CM

## 2022-09-18 ENCOUNTER — Ambulatory Visit
Admission: RE | Admit: 2022-09-18 | Discharge: 2022-09-18 | Disposition: A | Discharge status: Home / Self Care | Payer: 59 | Source: Ambulatory Visit | Attending: Internal Medicine | Admitting: Internal Medicine

## 2022-09-18 DIAGNOSIS — Z1231 Encounter for screening mammogram for malignant neoplasm of breast: Secondary | ICD-10-CM | POA: Insufficient documentation

## 2023-08-30 ENCOUNTER — Other Ambulatory Visit: Payer: Self-pay | Admitting: Internal Medicine

## 2023-08-30 DIAGNOSIS — Z1231 Encounter for screening mammogram for malignant neoplasm of breast: Secondary | ICD-10-CM

## 2023-09-23 ENCOUNTER — Ambulatory Visit
Admission: RE | Admit: 2023-09-23 | Discharge: 2023-09-23 | Disposition: A | Discharge status: Home / Self Care | Payer: 59 | Source: Ambulatory Visit | Attending: Internal Medicine | Admitting: Internal Medicine

## 2023-09-23 DIAGNOSIS — Z1231 Encounter for screening mammogram for malignant neoplasm of breast: Secondary | ICD-10-CM | POA: Insufficient documentation

## 2024-02-29 ENCOUNTER — Emergency Department

## 2024-02-29 ENCOUNTER — Ambulatory Visit
Admission: EM | Admit: 2024-02-29 | Discharge: 2024-02-29 | Disposition: A | Discharge status: Home / Self Care | Attending: Emergency Medicine | Admitting: Emergency Medicine

## 2024-02-29 ENCOUNTER — Other Ambulatory Visit: Payer: Self-pay

## 2024-02-29 ENCOUNTER — Emergency Department: Admitting: Registered Nurse

## 2024-02-29 ENCOUNTER — Encounter
Admission: EM | Disposition: A | Discharge status: Home / Self Care | Payer: Self-pay | Source: Home / Self Care | Attending: Emergency Medicine

## 2024-02-29 DIAGNOSIS — E785 Hyperlipidemia, unspecified: Secondary | ICD-10-CM | POA: Insufficient documentation

## 2024-02-29 DIAGNOSIS — K224 Dyskinesia of esophagus: Secondary | ICD-10-CM | POA: Diagnosis not present

## 2024-02-29 DIAGNOSIS — I10 Essential (primary) hypertension: Secondary | ICD-10-CM | POA: Insufficient documentation

## 2024-02-29 DIAGNOSIS — W44F3XA Food entering into or through a natural orifice, initial encounter: Secondary | ICD-10-CM | POA: Insufficient documentation

## 2024-02-29 DIAGNOSIS — Z79899 Other long term (current) drug therapy: Secondary | ICD-10-CM | POA: Diagnosis not present

## 2024-02-29 DIAGNOSIS — T18128A Food in esophagus causing other injury, initial encounter: Secondary | ICD-10-CM | POA: Diagnosis present

## 2024-02-29 DIAGNOSIS — Q399 Congenital malformation of esophagus, unspecified: Secondary | ICD-10-CM | POA: Insufficient documentation

## 2024-02-29 DIAGNOSIS — K222 Esophageal obstruction: Secondary | ICD-10-CM | POA: Insufficient documentation

## 2024-02-29 DIAGNOSIS — K219 Gastro-esophageal reflux disease without esophagitis: Secondary | ICD-10-CM | POA: Insufficient documentation

## 2024-02-29 DIAGNOSIS — K449 Diaphragmatic hernia without obstruction or gangrene: Secondary | ICD-10-CM | POA: Insufficient documentation

## 2024-02-29 DIAGNOSIS — Z8719 Personal history of other diseases of the digestive system: Secondary | ICD-10-CM

## 2024-02-29 LAB — CBC WITH DIFFERENTIAL/PLATELET
Abs Immature Granulocytes: 0.04 K/uL (ref 0.00–0.07)
Basophils Absolute: 0.1 K/uL (ref 0.0–0.1)
Basophils Relative: 1 %
Eosinophils Absolute: 0 K/uL (ref 0.0–0.5)
Eosinophils Relative: 0 %
HCT: 43.5 % (ref 36.0–46.0)
Hemoglobin: 14.7 g/dL (ref 12.0–15.0)
Immature Granulocytes: 0 %
Lymphocytes Relative: 25 %
Lymphs Abs: 3.2 K/uL (ref 0.7–4.0)
MCH: 28.5 pg (ref 26.0–34.0)
MCHC: 33.8 g/dL (ref 30.0–36.0)
MCV: 84.5 fL (ref 80.0–100.0)
Monocytes Absolute: 0.9 K/uL (ref 0.1–1.0)
Monocytes Relative: 7 %
Neutro Abs: 8.6 K/uL — ABNORMAL HIGH (ref 1.7–7.7)
Neutrophils Relative %: 67 %
Platelets: 335 K/uL (ref 150–400)
RBC: 5.15 MIL/uL — ABNORMAL HIGH (ref 3.87–5.11)
RDW: 13.4 % (ref 11.5–15.5)
WBC: 12.7 K/uL — ABNORMAL HIGH (ref 4.0–10.5)
nRBC: 0 % (ref 0.0–0.2)

## 2024-02-29 LAB — COMPREHENSIVE METABOLIC PANEL WITH GFR
ALT: 25 U/L (ref 0–44)
AST: 25 U/L (ref 15–41)
Albumin: 5 g/dL (ref 3.5–5.0)
Alkaline Phosphatase: 107 U/L (ref 38–126)
Anion gap: 13 (ref 5–15)
BUN: 17 mg/dL (ref 8–23)
CO2: 25 mmol/L (ref 22–32)
Calcium: 10.2 mg/dL (ref 8.9–10.3)
Chloride: 104 mmol/L (ref 98–111)
Creatinine, Ser: 0.62 mg/dL (ref 0.44–1.00)
GFR, Estimated: >60 mL/min (ref >60)
Glucose, Bld: 115 mg/dL — ABNORMAL HIGH (ref 70–99)
Potassium: 3 mmol/L — ABNORMAL LOW (ref 3.5–5.1)
Sodium: 142 mmol/L (ref 135–145)
Total Bilirubin: 1.2 mg/dL (ref 0.0–1.2)
Total Protein: 8.1 g/dL (ref 6.5–8.1)

## 2024-02-29 LAB — TROPONIN I (HIGH SENSITIVITY): Troponin I (High Sensitivity): 4 ng/L (ref <18)

## 2024-02-29 SURGERY — EGD (ESOPHAGOGASTRODUODENOSCOPY)
Anesthesia: General

## 2024-02-29 MED ORDER — PROPOFOL 10 MG/ML IV BOLUS
INTRAVENOUS | Status: AC
  Filled 2024-02-29: qty 20

## 2024-02-29 MED ORDER — FENTANYL CITRATE (PF) 100 MCG/2ML IJ SOLN
INTRAMUSCULAR | Status: DC | PRN
  Administered 2024-02-29 (×2): 50 ug via INTRAVENOUS

## 2024-02-29 MED ORDER — DEXMEDETOMIDINE HCL IN NACL 80 MCG/20ML IV SOLN
INTRAVENOUS | Status: DC | PRN
Start: 2024-02-29 — End: 2024-02-29
  Administered 2024-02-29: 4 ug via INTRAVENOUS

## 2024-02-29 MED ORDER — ONDANSETRON HCL 4 MG/2ML IJ SOLN
INTRAMUSCULAR | Status: DC | PRN
  Administered 2024-02-29: 4 mg via INTRAVENOUS

## 2024-02-29 MED ORDER — LIDOCAINE HCL (CARDIAC) PF 100 MG/5ML IV SOSY
PREFILLED_SYRINGE | INTRAVENOUS | Status: DC | PRN
  Administered 2024-02-29: 60 mg via INTRAVENOUS

## 2024-02-29 MED ORDER — GLUCAGON HCL RDNA (DIAGNOSTIC) 1 MG IJ SOLR
1.0000 mg | Freq: Once | INTRAMUSCULAR | Status: AC
  Administered 2024-02-29: 1 mg via INTRAVENOUS
  Filled 2024-02-29: qty 1

## 2024-02-29 MED ORDER — FENTANYL CITRATE (PF) 100 MCG/2ML IJ SOLN
INTRAMUSCULAR | Status: AC
  Filled 2024-02-29: qty 2

## 2024-02-29 MED ORDER — SODIUM CHLORIDE 0.9 % IV SOLN
INTRAVENOUS | Status: DC
  Administered 2024-02-29: 40 mL/h via INTRAVENOUS

## 2024-02-29 MED ORDER — GLYCOPYRROLATE 0.2 MG/ML IJ SOLN
INTRAMUSCULAR | Status: DC | PRN
  Administered 2024-02-29: .2 mg via INTRAVENOUS

## 2024-02-29 MED ORDER — SUCCINYLCHOLINE CHLORIDE 200 MG/10ML IV SOSY
PREFILLED_SYRINGE | INTRAVENOUS | Status: DC | PRN
  Administered 2024-02-29: 80 mg via INTRAVENOUS

## 2024-02-29 MED ORDER — PROPOFOL 10 MG/ML IV BOLUS
INTRAVENOUS | Status: DC | PRN
  Administered 2024-02-29: 140 mg via INTRAVENOUS
  Administered 2024-02-29 (×2): 30 mg via INTRAVENOUS

## 2024-02-29 MED ORDER — ONDANSETRON HCL 4 MG/2ML IJ SOLN
4.0000 mg | Freq: Once | INTRAMUSCULAR | Status: AC
  Administered 2024-02-29: 4 mg via INTRAVENOUS
  Filled 2024-02-29: qty 2

## 2024-02-29 MED ORDER — DEXAMETHASONE SODIUM PHOSPHATE 10 MG/ML IJ SOLN
INTRAMUSCULAR | Status: DC | PRN
  Administered 2024-02-29: 10 mg via INTRAVENOUS

## 2024-02-29 NOTE — Transfer of Care (Signed)
 Immediate Anesthesia Transfer of Care Note  Patient: Tiffany Good  Procedure(s) Performed: Procedure(s): EGD (ESOPHAGOGASTRODUODENOSCOPY) (N/A) REMOVAL, FOREIGN BODY, ESOPHAGUS  Patient Location: PACU and Endoscopy Unit  Anesthesia Type:General  Level of Consciousness: sedated  Airway & Oxygen Therapy: Patient Spontanous Breathing and Patient connected to nasal cannula oxygen  Post-op Assessment: Report given to RN and Post -op Vital signs reviewed and stable  Post vital signs: Reviewed and stable  Last Vitals:  Vitals:   02/29/24 2220 02/29/24 2304  BP: (!) 195/98 (!) 124/97  Pulse: (!) 110 98  Resp: 20 15  Temp: 36.8 C   SpO2: 98% 96%    Complications: No apparent anesthesia complications

## 2024-02-29 NOTE — ED Triage Notes (Signed)
 Pt to ed from home via POV for acid reflux feelings. Pt states I have had my esophagus stretched years ago and now I am having spasms in my esophagus and unable to keep anything down. Pt is caox4, in no acute distress and ambulatory in triage.

## 2024-02-29 NOTE — Anesthesia Preprocedure Evaluation (Signed)
 Anesthesia Evaluation  Patient identified by MRN, date of birth, ID band Patient awake    Reviewed: Allergy & Precautions, NPO status , Patient's Chart, lab work & pertinent test results  History of Anesthesia Complications Negative for: history of anesthetic complications  Airway Mallampati: II  TM Distance: >3 FB Neck ROM: full    Dental no notable dental hx.    Pulmonary neg pulmonary ROS   Pulmonary exam normal        Cardiovascular hypertension, On Medications negative cardio ROS Normal cardiovascular exam     Neuro/Psych  PSYCHIATRIC DISORDERS Anxiety     negative neurological ROS     GI/Hepatic Neg liver ROS, hiatal hernia,GERD  ,,CT chest  IMPRESSION: 1. Large esophageal hiatal hernia behind the heart. Esophageal wall thickening without significant esophageal dilatation. Changes are most likely to represent reflux disease although Barretts esophagus or neoplasm are not excluded and endoscopy should be considered. 2. No evidence of active pulmonary disease. 3. Mild aortic atherosclerosis.    Endo/Other  negative endocrine ROS    Renal/GU      Musculoskeletal   Abdominal   Peds  Hematology negative hematology ROS (+)   Anesthesia Other Findings Past Medical History: No date: Anxiety No date: GERD (gastroesophageal reflux disease) No date: Hypertension No date: Serum lipids high  Past Surgical History: No date: ABDOMINAL HYSTERECTOMY 08/08/2021: BREAST BIOPSY; Right     Comment:  stereo bx of calcs, ribbon marker, benign 02/29/2016: ESOPHAGOGASTRODUODENOSCOPY (EGD) WITH PROPOFOL      Comment:  Procedure: ESOPHAGOGASTRODUODENOSCOPY (EGD) WITH               PROPOFOL ;  Surgeon: Gladis RAYMOND Mariner, MD;  Location:               [facility] ENDOSCOPY;  Service: Endoscopy;; 05/17/2016: ESOPHAGOGASTRODUODENOSCOPY (EGD) WITH PROPOFOL ; N/A     Comment:  Procedure: ESOPHAGOGASTRODUODENOSCOPY (EGD) WITH                PROPOFOL ;  Surgeon: Gladis RAYMOND Mariner, MD;  Location:               [facility] ENDOSCOPY;  Service: Endoscopy;  Laterality: N/A; 07/17/2016: ESOPHAGOGASTRODUODENOSCOPY (EGD) WITH PROPOFOL ; N/A     Comment:  Procedure: ESOPHAGOGASTRODUODENOSCOPY (EGD) WITH               PROPOFOL ;  Surgeon: Gladis RAYMOND Mariner, MD;  Location:               [facility] ENDOSCOPY;  Service: Endoscopy;  Laterality: N/A;  BMI    Body Mass Index: 28.17 kg/m      Reproductive/Obstetrics negative OB ROS                              Anesthesia Physical Anesthesia Plan  ASA: 2 and emergent  Anesthesia Plan: General ETT   Post-op Pain Management: Minimal or no pain anticipated   Induction: Intravenous and Rapid sequence  PONV Risk Score and Plan: 3 and Ondansetron , Dexamethasone  and Treatment may vary due to age or medical condition  Airway Management Planned: Oral ETT  Additional Equipment:   Intra-op Plan:   Post-operative Plan: Extubation in OR  Informed Consent: I have reviewed the patients History and Physical, chart, labs and discussed the procedure including the risks, benefits and alternatives for the proposed anesthesia with the patient or authorized representative who has indicated his/her understanding and acceptance.     Dental Advisory Given  Plan Discussed with:  Anesthesiologist, CRNA and Surgeon  Anesthesia Plan Comments: (Patient consented for risks of anesthesia including but not limited to:  - adverse reactions to medications - damage to eyes, teeth, lips or other oral mucosa - nerve damage due to positioning  - sore throat or hoarseness - Damage to heart, brain, nerves, lungs, other parts of body or loss of life  Patient voiced understanding and assent.)         Anesthesia Quick Evaluation

## 2024-02-29 NOTE — Anesthesia Procedure Notes (Signed)
 Procedure Name: Intubation Date/Time: 02/29/2024 10:35 PM  Performed by: Tod Handing, CRNAPre-anesthesia Checklist: Patient identified, Emergency Drugs available, Suction available, Patient being monitored and Timeout performed Patient Re-evaluated:Patient Re-evaluated prior to induction Oxygen Delivery Method: Circle system utilized Preoxygenation: Pre-oxygenation with 100% oxygen Induction Type: IV induction, Cricoid Pressure applied and Rapid sequence Laryngoscope Size: 3 and McGrath Grade View: Grade II Tube type: Oral Tube size: 6.5 mm Number of attempts: 1 Airway Equipment and Method: Stylet Placement Confirmation: ETT inserted through vocal cords under direct vision, positive ETCO2 and breath sounds checked- equal and bilateral Secured at: 21 cm Tube secured with: Tape Dental Injury: Teeth and Oropharynx as per pre-operative assessment

## 2024-02-29 NOTE — Consult Note (Signed)
 Consultation  Referring Provider:   ED   Admit date: 02/29/2024 Consult date: 02/29/2024         Reason for Consultation: Food impaction              HPI:   Tiffany Good is a 68 y.o. lady with history of hypertension, GERD, and HLD here with inability to tolerate liquids. She doesn't note a particular time of eating something yesterday and it getting stuck but has been unable to tolerate any PO intake today. She notes she can get these symptoms occasionally but never for this long. She required an esophageal dilation in 2017 to 14.5 mm. She has a large hiatal hernia on CT imaging. No blood thinners. No family history of GI malignancies. No neck surgeries.   Past Medical History:  Diagnosis Date   Anxiety    GERD (gastroesophageal reflux disease)    Hypertension    Serum lipids high     Past Surgical History:  Procedure Laterality Date   ABDOMINAL HYSTERECTOMY     BREAST BIOPSY Right 08/08/2021   stereo bx of calcs, ribbon marker, benign   ESOPHAGOGASTRODUODENOSCOPY (EGD) WITH PROPOFOL   02/29/2016   Procedure: ESOPHAGOGASTRODUODENOSCOPY (EGD) WITH PROPOFOL ;  Surgeon: Gladis RAYMOND Mariner, MD;  Location: [facility] ENDOSCOPY;  Service: Endoscopy;;   ESOPHAGOGASTRODUODENOSCOPY (EGD) WITH PROPOFOL  N/A 05/17/2016   Procedure: ESOPHAGOGASTRODUODENOSCOPY (EGD) WITH PROPOFOL ;  Surgeon: Gladis RAYMOND Mariner, MD;  Location: [facility] ENDOSCOPY;  Service: Endoscopy;  Laterality: N/A;   ESOPHAGOGASTRODUODENOSCOPY (EGD) WITH PROPOFOL  N/A 07/17/2016   Procedure: ESOPHAGOGASTRODUODENOSCOPY (EGD) WITH PROPOFOL ;  Surgeon: Gladis RAYMOND Mariner, MD;  Location: [facility] ENDOSCOPY;  Service: Endoscopy;  Laterality: N/A;    Family History  Problem Relation Age of Onset   Breast cancer Sister 58    Social History   Tobacco Use   Smoking status: Never   Smokeless tobacco: Never  Substance Use Topics   Alcohol use: No   Drug use: No    Prior to Admission medications   Medication Sig Start Date End Date Taking?  Authorizing Provider  amLODipine (NORVASC) 5 MG tablet Take 5 mg by mouth daily.    [provider]  atorvastatin (LIPITOR) 20 MG tablet Take 20 mg by mouth daily.    [provider]  Cholecalciferol (VITAMIN D3) 5000 units TBDP Take 5,000 Units by mouth.    [provider]  escitalopram (LEXAPRO) 10 MG tablet Take 10 mg by mouth daily.    [provider]  pantoprazole (PROTONIX) 40 MG tablet Take 40 mg by mouth daily.    [provider]    Current Facility-Administered Medications  Medication Dose Route Frequency Provider Last Rate Last Admin   0.9 %  sodium chloride  infusion   Intravenous Continuous Maryruth Ole DASEN, MD 40 mL/hr at 02/29/24 2224 40 mL/hr at 02/29/24 2224    Allergies as of 02/29/2024   (No Known Allergies)     Review of Systems:    All systems reviewed and negative except where noted in HPI.  Review of Systems  Constitutional:  Negative for chills and fever.  Respiratory:  Negative for shortness of breath.   Cardiovascular:  Positive for chest pain.  Skin:  Negative for rash.  Neurological:  Negative for focal weakness.  Psychiatric/Behavioral:  Negative for substance abuse.   All other systems reviewed and are negative.      Physical Exam:  Vital signs in last 24 hours: Temp:  [97.8 F (36.6 C)-99.3 F (37.4 C)] 98.2 F (36.8 C) (  07/05 2220) Pulse Rate:  [80-110] 110 (07/05 2220) Resp:  [15-20] 20 (07/05 2220) BP: (139-195)/(83-98) 195/98 (07/05 2220) SpO2:  [98 %-99 %] 98 % (07/05 2220)   General:   Pleasant in NAD Head:  Normocephalic and atraumatic. Eyes:   No icterus.   Conjunctiva pink. Ears:  Normal auditory acuity. Mouth: Mucosa pink moist, no lesions. Neck:  Supple; no masses felt Lungs: No respiratory distress Abdomen:   Flat, soft, nondistended, nontender Rectal:  Not performed.  Msk:  No clubbing or cyanosis Symmetrical without gross deformities. Neurologic:  Alert and  oriented x4;   No focal deficits Skin:  Warm, dry, pink without significant lesions or rashes. Psych:  Alert and cooperative. Normal affect.  LAB RESULTS: Recent Labs    02/29/24 1721  WBC 12.7*  HGB 14.7  HCT 43.5  PLT 335   BMET Recent Labs    02/29/24 1721  NA 142  K 3.0*  CL 104  CO2 25  GLUCOSE 115*  BUN 17  CREATININE 0.62  CALCIUM 10.2   LFT Recent Labs    02/29/24 1721  PROT 8.1  ALBUMIN 5.0  AST 25  ALT 25  ALKPHOS 107  BILITOT 1.2   PT/INR No results for input(s): LABPROT, INR in the last 72 hours.  STUDIES: CT Chest Wo Contrast Result Date: 02/29/2024 CLINICAL DATA:  Food impaction. Feeling of acid reflux. Previous esophageal stretching procedures. Now with spasms in the esophagus. EXAM: CT CHEST WITHOUT CONTRAST TECHNIQUE: Multidetector CT imaging of the chest was performed following the standard protocol without IV contrast. RADIATION DOSE REDUCTION: This exam was performed according to the departmental dose-optimization program which includes automated exposure control, adjustment of the mA and/or kV according to patient size and/or use of iterative reconstruction technique. COMPARISON:  Esophagram 02/17/2016 FINDINGS: Cardiovascular: Normal heart size. No pericardial effusions. Normal caliber thoracic aorta. Calcification of the aorta and coronary arteries. Mediastinum/Nodes: Large esophageal hiatal hernia behind the heart. Esophagus is not abnormally distended but is air filled with evidence of lower esophageal wall thickening. In the setting of hiatal hernia, this is most likely due to reflux disease but may indicate Barretts esophagus. Underlying neoplasm is not excluded. Consider follow-up endoscopy. No definitive esophageal foreign body is identified. Scattered lymph nodes are not pathologically enlarged. Thyroid gland is unremarkable. Lungs/Pleura: Mild linear scarring in the lung bases. No airspace disease or consolidation. No significant pulmonary nodules. No  pleural effusion or pneumothorax. Upper Abdomen: No acute abnormality. Musculoskeletal: Degenerative changes in the spine. T2 thoracic vertebral hemangioma. No acute bony abnormalities. IMPRESSION: 1. Large esophageal hiatal hernia behind the heart. Esophageal wall thickening without significant esophageal dilatation. Changes are most likely to represent reflux disease although Barretts esophagus or neoplasm are not excluded and endoscopy should be considered. 2. No evidence of active pulmonary disease. 3. Mild aortic atherosclerosis. Electronically Signed   By: Elsie Gravely M.D.   On: 02/29/2024 20:38       Impression / Plan:   68 y.o. lady with history of hypertension, GERD, and HLD here with inability to tolerate liquids. Suspect she is having a symptomatic hiatal hernia but will perform EGD to rule out an acute obstruction.  - NPO - EGD tonight - further recs after procedure  Frederic Schick MD, MPH [facility]

## 2024-02-29 NOTE — ED Notes (Addendum)
 Pt's family reports Pt is now having difficulty swallowing saliva.  Upon assessment, Pt was still able to easily speak full sentences and able to maintain saliva.  Pt reports she has started sticking her fingers down her throat for relief.  Unable to determine if she is doing it to be able to swallow or spit up.

## 2024-02-29 NOTE — ED Provider Notes (Cosign Needed Addendum)
 [facility] Emergency Department Provider Note     Event Date/Time   First MD Initiated Contact with Patient 02/29/24 1836     (approximate)   History   Gastroesophageal Reflux   HPI  Tiffany Good is a 68 y.o. female with a history of anxiety, HTN, GERD, and esophageal stricture, presents to the ED endorsing symptoms consistent with acid reflux.  Patient reports experiencing esophageal spasms and food sensation since yesterday evening.  Her last p.o. intake was a piece of chicken, that preceded her symptoms.  Since that time she has had the urge to induce vomiting by putting her fingers in her throat.  She also reports a sensation of pressure in the central chest region.  Patient is spitting up frothy saliva.  Patient reports difficulty eating and swallowing solid foods.  She presents in no acute distress for evaluation.  No fever, chills, or sweats.   Physical Exam   Triage Vital Signs: ED Triage Vitals  Encounter Vitals Group     BP 02/29/24 1721 (!) 182/90     Girls Systolic BP Percentile --      Girls Diastolic BP Percentile --      Boys Systolic BP Percentile --      Boys Diastolic BP Percentile --      Pulse Rate 02/29/24 1721 98     Resp 02/29/24 1721 16     Temp 02/29/24 1721 99.3 F (37.4 C)     Temp Source 02/29/24 1721 Oral     SpO2 02/29/24 1721 98 %     Weight --      Height 02/29/24 1718 5' 3 (1.6 m)     Head Circumference --      Peak Flow --      Pain Score 02/29/24 1718 0     Pain Loc --      Pain Education --      Exclude from Growth Chart --     Most recent vital signs: Vitals:   02/29/24 1721 02/29/24 2000  BP: (!) 182/90 139/83  Pulse: 98 80  Resp: 16 15  Temp: 99.3 F (37.4 C) 97.8 F (36.6 C)  SpO2: 98% 99%    General Awake, no distress. NAD HEENT NCAT. PERRL. EOMI. No rhinorrhea. Mucous membranes are moist.  CV:  Good peripheral perfusion. RRR RESP:  Normal effort. CTA ABD:  No distention.  Soft and  nontender.  Normal bowel sounds noted.  Patient with persistent episodes of nausea with emesis only producing frothy right saliva.  Emesis was induced after attempted cola ingestion, following glucagon  administration.   ED Results / Procedures / Treatments   Labs (all labs ordered are listed, but only abnormal results are displayed) Labs Reviewed  CBC WITH DIFFERENTIAL/PLATELET - Abnormal; Notable for the following components:      Result Value   WBC 12.7 (*)    RBC 5.15 (*)    Neutro Abs 8.6 (*)    All other components within normal limits  COMPREHENSIVE METABOLIC PANEL WITH GFR - Abnormal; Notable for the following components:   Potassium 3.0 (*)    Glucose, Bld 115 (*)    All other components within normal limits  TROPONIN I (HIGH SENSITIVITY)   EKG   RADIOLOGY  I personally viewed and evaluated these images as part of my medical decision making, as well as reviewing the written report by the radiologist.  ED Provider Interpretation: No evidence of esophageal stricture or food bolus.  Evidence of stable esophageal hiatal hernia  CT Chest Wo Contrast Result Date: 02/29/2024 CLINICAL DATA:  Food impaction. Feeling of acid reflux. Previous esophageal stretching procedures. Now with spasms in the esophagus. EXAM: CT CHEST WITHOUT CONTRAST TECHNIQUE: Multidetector CT imaging of the chest was performed following the standard protocol without IV contrast. RADIATION DOSE REDUCTION: This exam was performed according to the departmental dose-optimization program which includes automated exposure control, adjustment of the mA and/or kV according to patient size and/or use of iterative reconstruction technique. COMPARISON:  Esophagram 02/17/2016 FINDINGS: Cardiovascular: Normal heart size. No pericardial effusions. Normal caliber thoracic aorta. Calcification of the aorta and coronary arteries. Mediastinum/Nodes: Large esophageal hiatal hernia behind the heart. Esophagus is not abnormally  distended but is air filled with evidence of lower esophageal wall thickening. In the setting of hiatal hernia, this is most likely due to reflux disease but may indicate Barretts esophagus. Underlying neoplasm is not excluded. Consider follow-up endoscopy. No definitive esophageal foreign body is identified. Scattered lymph nodes are not pathologically enlarged. Thyroid gland is unremarkable. Lungs/Pleura: Mild linear scarring in the lung bases. No airspace disease or consolidation. No significant pulmonary nodules. No pleural effusion or pneumothorax. Upper Abdomen: No acute abnormality. Musculoskeletal: Degenerative changes in the spine. T2 thoracic vertebral hemangioma. No acute bony abnormalities. IMPRESSION: 1. Large esophageal hiatal hernia behind the heart. Esophageal wall thickening without significant esophageal dilatation. Changes are most likely to represent reflux disease although Barretts esophagus or neoplasm are not excluded and endoscopy should be considered. 2. No evidence of active pulmonary disease. 3. Mild aortic atherosclerosis. Electronically Signed   By: Elsie Gravely M.D.   On: 02/29/2024 20:38     PROCEDURES:  Critical Care performed: No  Procedures   MEDICATIONS ORDERED IN ED: Medications  glucagon  (human recombinant) (GLUCAGEN ) injection 1 mg (1 mg Intravenous Given 02/29/24 1930)  ondansetron  (ZOFRAN ) injection 4 mg (4 mg Intravenous Given 02/29/24 2021)     IMPRESSION / MDM / ASSESSMENT AND PLAN / ED COURSE  I reviewed the triage vital signs and the nursing notes.                              Differential diagnosis includes, but is not limited to, ACS, aortic dissection, pulmonary embolism, cardiac tamponade, pneumothorax, pneumonia, pericarditis, myocarditis, GI-related causes including esophagitis/gastritis, and musculoskeletal chest wall pain.    Patient's presentation is most consistent with acute presentation with potential threat to life or bodily  function.  Patient's diagnosis is consistent with clinical presentation concerning for food bolus secondary to esophageal stricture versus esophageal spasm.  Patient presents with reports of difficulty swallowing any solid food or liquids since last night.  Her last p.o. was a piece of chicken prior to symptom onset.  She presents to the ED today with her husband, endorsing epigastric and chest pressure and fullness.  Patient treated empirically with IV glucagon  dose and attempted food bolus passage, which was unsuccessful.  CT chest without contrast, interpreted by me, shows no evidence of a frank food impaction of the esophagus.  Patient unable to tolerate p.o. in the ED.    ----------------------------------------- 9:34 PM on 02/29/2024 ----------------------------------------- Reached out to Dr. Maryruth (GI) via secure chat.  I reviewed the case with him, raising my concern for esophageal spasm versus food impaction despite reassuring CT imaging.  He will evaluate the patient with an upper endoscopy at this time.  Patient and her husband are aware of the  plan and agreeable at this time.   FINAL CLINICAL IMPRESSION(S) / ED DIAGNOSES   Final diagnoses:  Food impaction of esophagus, initial encounter  H/O esophageal spasm     Rx / DC Orders   ED Discharge Orders     None        Note:  This document was prepared using Dragon voice recognition software and may include unintentional dictation errors.    Loyd Candida LULLA Aldona, PA-C 02/29/24 2156    Loyd Candida LULLA Aldona, PA-C 02/29/24 2200    Malvina Alm DASEN, MD 03/01/24 [phone]

## 2024-02-29 NOTE — Op Note (Signed)
 [facility] Gastroenterology Patient Name: Tiffany Good Procedure Date: 02/29/2024 10:19 PM MRN: [id number] Account #: [id number] Date of Birth: [birth year] Admit Type: Outpatient Age: 68 Room: [facility] ENDO ROOM 1 Gender: Female Note Status: Finalized Instrument Name: [id number] [id number] Procedure:             Upper GI endoscopy Indications:           Foreign body in the esophagus Providers:             Ole Schick MD, MD Referring MD:          Ophelia Sage, MD (Referring MD) Medicines:             Monitored Anesthesia Care Complications:         No immediate complications. Procedure:             Pre-Anesthesia Assessment:                        - Prior to the procedure, a History and Physical was                         performed, and patient medications and allergies were                         reviewed. The patient is competent. The risks and                         benefits of the procedure and the sedation options and                         risks were discussed with the patient. All questions                         were answered and informed consent was obtained.                         Patient identification and proposed procedure were                         verified by the physician, the nurse, the                         anesthesiologist, the anesthetist and the technician                         in the endoscopy suite. Mental Status Examination:                         alert and oriented. Airway Examination: normal                         oropharyngeal airway and neck mobility. Respiratory                         Examination: clear to auscultation. CV Examination:                         normal. Prophylactic Antibiotics: The patient does not  require prophylactic antibiotics. Prior                         Anticoagulants: The patient has taken no anticoagulant                         or antiplatelet agents. ASA Grade Assessment:  E -                         Emergency. After reviewing the risks and benefits, the                         patient was deemed in satisfactory condition to                         undergo the procedure. The anesthesia plan was to use                         monitored anesthesia care (MAC). Immediately prior to                         administration of medications, the patient was                         re-assessed for adequacy to receive sedatives. The                         heart rate, respiratory rate, oxygen saturations,                         blood pressure, adequacy of pulmonary ventilation, and                         response to care were monitored throughout the                         procedure. The physical status of the patient was                         re-assessed after the procedure.                        After obtaining informed consent, the endoscope was                         passed under direct vision. Throughout the procedure,                         the patient's blood pressure, pulse, and oxygen                         saturations were monitored continuously. The Endoscope                         was introduced through the mouth, and advanced to the                         second part of duodenum. The upper GI endoscopy was  accomplished without difficulty. The patient tolerated                         the procedure well. Findings:      The lower third of the esophagus was moderately tortuous.      Food was found at the gastroesophageal junction. Removal was       accomplished with a Roth net. The foreign body removal site was examined       and showed erythema. Estimated blood loss was minimal.      One benign-appearing, intrinsic moderate stenosis was found. The       stenosis was traversed.      A 5 cm hiatal hernia was present.      The entire examined stomach was normal.      The examined duodenum was normal. Impression:            -  Tortuous esophagus.                        - Food was found in the esophagus. Removal was                         successful.                        - Benign-appearing esophageal stenosis.                        - 5 cm hiatal hernia.                        - Normal stomach.                        - Normal examined duodenum. Recommendation:        - Discharge patient to home.                        - Soft diet.                        - Continue present medications.                        - Repeat upper endoscopy at appointment to be                         scheduled for esophageal dilation.                        - Return to GI clinic at appointment to be scheduled. Procedure Code(s):     --- Professional ---                        [phone], Esophagogastroduodenoscopy, flexible,                         transoral; with removal of foreign body(s) Diagnosis Code(s):     --- Professional ---                        Q39.9, Congenital malformation of esophagus,  unspecified                        T18.128A, Food in esophagus causing other injury,                         initial encounter                        K22.2, Esophageal obstruction                        K44.9, Diaphragmatic hernia without obstruction or                         gangrene                        T18.108A, Unspecified foreign body in esophagus                         causing other injury, initial encounter CPT copyright 2022 [employer]. All rights reserved. The codes documented in this report are preliminary and upon coder review may  be revised to meet current compliance requirements. Ole Schick MD, MD 02/29/2024 10:59:11 PM Number of Addenda: 0 Note Initiated On: 02/29/2024 10:19 PM Estimated Blood Loss:  Estimated blood loss: none.      [facility]

## 2024-03-02 ENCOUNTER — Encounter: Payer: Self-pay | Admitting: Gastroenterology

## 2024-03-02 NOTE — Anesthesia Postprocedure Evaluation (Signed)
 Anesthesia Post Note  Patient: Tiffany Good  Procedure(s) Performed: EGD (ESOPHAGOGASTRODUODENOSCOPY) REMOVAL, FOREIGN BODY, ESOPHAGUS  Patient location during evaluation: Endoscopy Anesthesia Type: General Level of consciousness: awake and alert Pain management: pain level controlled Vital Signs Assessment: post-procedure vital signs reviewed and stable Respiratory status: spontaneous breathing, nonlabored ventilation, respiratory function stable and patient connected to nasal cannula oxygen Cardiovascular status: blood pressure returned to baseline and stable Postop Assessment: no apparent nausea or vomiting Anesthetic complications: no   No notable events documented.   Last Vitals:  Vitals:   02/29/24 2220 02/29/24 2304  BP: (!) 195/98 (!) 124/97  Pulse: (!) 110 98  Resp: 20 15  Temp: 36.8 C   SpO2: 98% 96%    Last Pain:  Vitals:   02/29/24 2325  TempSrc:   PainSc: 0-No pain                 [name]

## 2024-03-26 ENCOUNTER — Ambulatory Visit: Payer: Self-pay

## 2024-03-26 DIAGNOSIS — K222 Esophageal obstruction: Secondary | ICD-10-CM | POA: Diagnosis present

## 2024-03-26 DIAGNOSIS — K449 Diaphragmatic hernia without obstruction or gangrene: Secondary | ICD-10-CM | POA: Diagnosis not present

## 2024-05-06 ENCOUNTER — Encounter: Payer: Self-pay | Admitting: *Deleted

## 2024-05-25 ENCOUNTER — Ambulatory Visit: Admitting: Certified Registered Nurse Anesthetist

## 2024-05-25 ENCOUNTER — Ambulatory Visit
Admission: RE | Admit: 2024-05-25 | Discharge: 2024-05-25 | Disposition: A | Discharge status: Home / Self Care | Payer: Self-pay | Attending: Gastroenterology | Admitting: Gastroenterology

## 2024-05-25 ENCOUNTER — Encounter
Admission: RE | Disposition: A | Discharge status: Home / Self Care | Payer: Self-pay | Source: Home / Self Care | Attending: Gastroenterology

## 2024-05-25 DIAGNOSIS — F419 Anxiety disorder, unspecified: Secondary | ICD-10-CM | POA: Diagnosis not present

## 2024-05-25 DIAGNOSIS — K219 Gastro-esophageal reflux disease without esophagitis: Secondary | ICD-10-CM | POA: Diagnosis not present

## 2024-05-25 DIAGNOSIS — I10 Essential (primary) hypertension: Secondary | ICD-10-CM | POA: Diagnosis not present

## 2024-05-25 DIAGNOSIS — E785 Hyperlipidemia, unspecified: Secondary | ICD-10-CM | POA: Insufficient documentation

## 2024-05-25 DIAGNOSIS — K449 Diaphragmatic hernia without obstruction or gangrene: Secondary | ICD-10-CM | POA: Insufficient documentation

## 2024-05-25 DIAGNOSIS — K222 Esophageal obstruction: Secondary | ICD-10-CM | POA: Insufficient documentation

## 2024-05-25 DIAGNOSIS — Z79899 Other long term (current) drug therapy: Secondary | ICD-10-CM | POA: Insufficient documentation

## 2024-05-25 HISTORY — DX: Hyperparathyroidism, unspecified: E21.3

## 2024-05-25 HISTORY — DX: Hyperlipidemia, unspecified: E78.5

## 2024-05-25 HISTORY — DX: Benign neoplasm of unspecified ovary: D27.9

## 2024-05-25 SURGERY — EGD (ESOPHAGOGASTRODUODENOSCOPY)
Anesthesia: General

## 2024-05-25 MED ORDER — PROPOFOL 500 MG/50ML IV EMUL
INTRAVENOUS | Status: DC | PRN
  Administered 2024-05-25: 150 ug/kg/min via INTRAVENOUS

## 2024-05-25 MED ORDER — LIDOCAINE HCL (CARDIAC) PF 100 MG/5ML IV SOSY
PREFILLED_SYRINGE | INTRAVENOUS | Status: DC | PRN
  Administered 2024-05-25: 80 mg via INTRAVENOUS

## 2024-05-25 MED ORDER — SODIUM CHLORIDE 0.9 % IV SOLN: INTRAVENOUS | Status: DC

## 2024-05-25 MED ORDER — PROPOFOL 10 MG/ML IV BOLUS
INTRAVENOUS | Status: DC | PRN
  Administered 2024-05-25: 70 mg via INTRAVENOUS

## 2024-05-25 NOTE — H&P (Signed)
 Outpatient short stay form Pre-procedure 05/25/2024  Tiffany ONEIDA Schick, MD  Primary Physician: Fernande Ophelia JINNY DOUGLAS, MD  Reason for visit:  Esophageal stricture  History of present illness:    68 y/o lady with hypertension and history of esophageal stricture here for EGD for dilation. Last EGD, stricture was dilated to 15 mm. No blood thinners. No neck surgeries.    Current Facility-Administered Medications:    0.9 %  sodium chloride  infusion, , Intravenous, Continuous, Eyanna Mcgonagle, Tiffany ONEIDA, MD  Medications Prior to Admission  Medication Sig Dispense Refill Last Dose/Taking   amLODipine (NORVASC) 5 MG tablet Take 5 mg by mouth daily.   05/25/2024 at  6:00 AM   atorvastatin (LIPITOR) 20 MG tablet Take 20 mg by mouth daily.   05/24/2024   Cholecalciferol (VITAMIN D3) 5000 units TBDP Take 5,000 Units by mouth. (Patient not taking: Reported on 05/25/2024)   Not Taking   escitalopram (LEXAPRO) 10 MG tablet Take 10 mg by mouth daily. (Patient not taking: Reported on 05/25/2024)   Not Taking   pantoprazole (PROTONIX) 40 MG tablet Take 40 mg by mouth daily. (Patient not taking: Reported on 05/25/2024)   Not Taking     Allergies  Allergen Reactions   Ibandronate Other (See Comments)    SEVERE DYSPEPSIA     Past Medical History:  Diagnosis Date   Anxiety    GERD (gastroesophageal reflux disease)    Hyperlipidemia    Hyperparathyroidism    Hypertension    Mucinous cystadenoma    Serum lipids high     Review of systems:  Otherwise negative.    Physical Exam  Gen: Alert, oriented. Appears stated age.  HEENT: PERRLA. Lungs: No respiratory distress CV: RRR Abd: soft, benign, no masses Ext: No edema    Planned procedures: Proceed with EGD. The patient understands the nature of the planned procedure, indications, risks, alternatives and potential complications including but not limited to bleeding, infection, perforation, damage to internal organs and possible oversedation/side  effects from anesthesia. The patient agrees and gives consent to proceed.  Please refer to procedure notes for findings, recommendations and patient disposition/instructions.     Tiffany ONEIDA Schick, MD [facility]

## 2024-05-25 NOTE — Anesthesia Postprocedure Evaluation (Signed)
 Anesthesia Post Note  Patient: Tiffany Good  Procedure(s) Performed: EGD (ESOPHAGOGASTRODUODENOSCOPY)  Patient location during evaluation: PACU Anesthesia Type: General Level of consciousness: awake and alert Pain management: pain level controlled Vital Signs Assessment: post-procedure vital signs reviewed and stable Respiratory status: spontaneous breathing Cardiovascular status: stable Anesthetic complications: no   No notable events documented.   Last Vitals:  Vitals:   05/25/24 1027 05/25/24 1052  BP: (!) 158/81 115/74  Pulse: (!) 108 89  Resp: 20 18  Temp: 36.5 C (!) 35.8 C  SpO2: 100% 99%    Last Pain:  Vitals:   05/25/24 1052  TempSrc: Temporal  PainSc: 0-No pain                 VAN STAVEREN,Elgar Scoggins

## 2024-05-25 NOTE — Transfer of Care (Signed)
 Immediate Anesthesia Transfer of Care Note  Patient: ALAYASIA BREEDING  Procedure(s) Performed: EGD (ESOPHAGOGASTRODUODENOSCOPY)  Patient Location: PACU  Anesthesia Type:General  Level of Consciousness: awake, alert , and oriented  Airway & Oxygen Therapy: Patient Spontanous Breathing  Post-op Assessment: Report given to RN and Post -op Vital signs reviewed and stable  Post vital signs: Reviewed and stable  Last Vitals:  Vitals Value Taken Time  BP    Temp    Pulse    Resp    SpO2      Last Pain:  Vitals:   05/25/24 1027  TempSrc: Temporal  PainSc: 0-No pain         Complications: No notable events documented.

## 2024-05-25 NOTE — Anesthesia Procedure Notes (Signed)
 Date/Time: 05/25/2024 10:39 AM  Performed by: Duwayne Craven, CRNAPre-anesthesia Checklist: Patient identified, Emergency Drugs available, Suction available, Patient being monitored and Timeout performed Patient Re-evaluated:Patient Re-evaluated prior to induction Oxygen Delivery Method: Nasal cannula Induction Type: IV induction Placement Confirmation: CO2 detector and positive ETCO2

## 2024-05-25 NOTE — Interval H&P Note (Signed)
 History and Physical Interval Note:  05/25/2024 10:36 AM  Tiffany Good  has presented today for surgery, with the diagnosis of K22.2 (ICD-10-CM) - Esophageal stricture.  The various methods of treatment have been discussed with the patient and family. After consideration of risks, benefits and other options for treatment, the patient has consented to  Procedure(s): EGD (ESOPHAGOGASTRODUODENOSCOPY) (N/A) as a surgical intervention.  The patient's history has been reviewed, patient examined, no change in status, stable for surgery.  I have reviewed the patient's chart and labs.  Questions were answered to the patient's satisfaction.     Ole ONEIDA Schick  Ok to proceed with EGD

## 2024-05-25 NOTE — Anesthesia Preprocedure Evaluation (Addendum)
 Anesthesia Evaluation  Patient identified by MRN, date of birth, ID band Patient awake    Reviewed: Allergy & Precautions, NPO status , Patient's Chart, lab work & pertinent test results  Airway Mallampati: III  TM Distance: <3 FB Neck ROM: Full    Dental  (+) Teeth Intact   Pulmonary neg pulmonary ROS   Pulmonary exam normal        Cardiovascular Exercise Tolerance: Good hypertension, Pt. on medications negative cardio ROS Normal cardiovascular exam Rhythm:Regular Rate:Normal     Neuro/Psych   Anxiety     negative neurological ROS  negative psych ROS   GI/Hepatic negative GI ROS, Neg liver ROS,GERD  Medicated,,  Endo/Other  negative endocrine ROS    Renal/GU negative Renal ROS  negative genitourinary   Musculoskeletal   Abdominal Normal abdominal exam  (+)   Peds negative pediatric ROS (+)  Hematology negative hematology ROS (+)   Anesthesia Other Findings Past Medical History: No date: Anxiety No date: GERD (gastroesophageal reflux disease) No date: Hyperlipidemia No date: Hyperparathyroidism No date: Hypertension No date: Mucinous cystadenoma No date: Serum lipids high  Past Surgical History: No date: ABDOMINAL HYSTERECTOMY 08/08/2021: BREAST BIOPSY; Right     Comment:  stereo bx of calcs, ribbon marker, benign No date: CYSTOURETHROSCOPY; N/A No date: DIAGNOSTIC LAPAROSCOPY; N/A 02/29/2024: ESOPHAGOGASTRODUODENOSCOPY; N/A     Comment:  Procedure: EGD (ESOPHAGOGASTRODUODENOSCOPY);  Surgeon:               Maryruth Ole DASEN, MD;  Location: [facility] ENDOSCOPY;                Service: Endoscopy;  Laterality: N/A; 02/29/2016: ESOPHAGOGASTRODUODENOSCOPY (EGD) WITH PROPOFOL      Comment:  Procedure: ESOPHAGOGASTRODUODENOSCOPY (EGD) WITH               PROPOFOL ;  Surgeon: Gladis RAYMOND Mariner, MD;  Location:               [facility] ENDOSCOPY;  Service: Endoscopy;; 05/17/2016: ESOPHAGOGASTRODUODENOSCOPY (EGD) WITH  PROPOFOL ; N/A     Comment:  Procedure: ESOPHAGOGASTRODUODENOSCOPY (EGD) WITH               PROPOFOL ;  Surgeon: Gladis RAYMOND Mariner, MD;  Location:               [facility] ENDOSCOPY;  Service: Endoscopy;  Laterality: N/A; 07/17/2016: ESOPHAGOGASTRODUODENOSCOPY (EGD) WITH PROPOFOL ; N/A     Comment:  Procedure: ESOPHAGOGASTRODUODENOSCOPY (EGD) WITH               PROPOFOL ;  Surgeon: Gladis RAYMOND Mariner, MD;  Location:               [facility] ENDOSCOPY;  Service: Endoscopy;  Laterality: N/A; 02/29/2024: FOREIGN BODY REMOVAL ESOPHAGEAL     Comment:  Procedure: REMOVAL, FOREIGN BODY, ESOPHAGUS;  Surgeon:               Maryruth Ole DASEN, MD;  Location: [facility] ENDOSCOPY;                Service: Endoscopy;; No date: LYSIS FALLOPIAN TUBE ADHESIONS; Bilateral No date: OOPHORECTOMY; N/A No date: RESECTION OVARIAN MALIGANCY W/RADICAL DISSECTION; Bilateral  BMI    Body Mass Index: 28.48 kg/m      Reproductive/Obstetrics negative OB ROS                              Anesthesia Physical Anesthesia Plan  ASA: 2  Anesthesia Plan: General   Post-op Pain  Management:    Induction:   PONV Risk Score and Plan: Propofol  infusion and TIVA  Airway Management Planned: Natural Airway and Nasal Cannula  Additional Equipment:   Intra-op Plan:   Post-operative Plan:   Informed Consent: I have reviewed the patients History and Physical, chart, labs and discussed the procedure including the risks, benefits and alternatives for the proposed anesthesia with the patient or authorized representative who has indicated his/her understanding and acceptance.     Dental Advisory Given  Plan Discussed with: CRNA  Anesthesia Plan Comments:         Anesthesia Quick Evaluation

## 2024-05-25 NOTE — Op Note (Signed)
 [facility] Patient Name: Tiffany Good Procedure Date: 05/25/2024 10:37 AM MRN: [id number] Account #: [id number] Date of Birth: 1956/03/29 Admit Type: Outpatient Age: 68 Room: [facility] ENDO ROOM 3 Gender: Female Note Status: Finalized Instrument Name: Barnie GI Scope [id number] Procedure:             Upper GI endoscopy Indications:           Stricture of the esophagus Providers:             Ole Schick MD, MD Referring MD:          Ophelia Sage, MD (Referring MD) Medicines:             Monitored Anesthesia Care Complications:         No immediate complications. Estimated blood loss:                         Minimal. Procedure:             Pre-Anesthesia Assessment:                        - Prior to the procedure, a History and Physical was                         performed, and patient medications and allergies were                         reviewed. The patient is competent. The risks and                         benefits of the procedure and the sedation options and                         risks were discussed with the patient. All questions                         were answered and informed consent was obtained.                         Patient identification and proposed procedure were                         verified by the physician, the nurse, the                         anesthesiologist, the anesthetist and the technician                         in the endoscopy suite. Mental Status Examination:                         alert and oriented. Airway Examination: normal                         oropharyngeal airway and neck mobility. Respiratory                         Examination: clear to auscultation. CV Examination:  normal. Prophylactic Antibiotics: The patient does not                         require prophylactic antibiotics. Prior                         Anticoagulants: The patient has taken no anticoagulant                          or antiplatelet agents. ASA Grade Assessment: II - A                         patient with mild systemic disease. After reviewing                         the risks and benefits, the patient was deemed in                         satisfactory condition to undergo the procedure. The                         anesthesia plan was to use monitored anesthesia care                         (MAC). Immediately prior to administration of                         medications, the patient was re-assessed for adequacy                         to receive sedatives. The heart rate, respiratory                         rate, oxygen saturations, blood pressure, adequacy of                         pulmonary ventilation, and response to care were                         monitored throughout the procedure. The physical                         status of the patient was re-assessed after the                         procedure.                        After obtaining informed consent, the endoscope was                         passed under direct vision. Throughout the procedure,                         the patient's blood pressure, pulse, and oxygen                         saturations were monitored continuously. The Endoscope  was introduced through the mouth, and advanced to the                         second part of duodenum. The upper GI endoscopy was                         accomplished without difficulty. The patient tolerated                         the procedure well. Findings:      A moderate Schatzki ring was found in the lower third of the esophagus.       A TTS dilator was passed through the scope. Dilation with a 15-16.5-18       mm balloon dilator was performed to 18 mm. The dilation site was       examined and showed moderate mucosal disruption. Estimated blood loss       was minimal.      A 6 cm hiatal hernia was present.      The entire examined stomach was normal.      The  examined duodenum was normal. Impression:            - Moderate Schatzki ring. Dilated.                        - 6 cm hiatal hernia.                        - Normal stomach.                        - Normal examined duodenum.                        - No specimens collected. Recommendation:        - Discharge patient to home.                        - Resume previous diet.                        - Use a proton pump inhibitor PO Tiffany.                        - Return to referring physician as previously                         scheduled. Procedure Code(s):     --- Professional ---                        [phone], Esophagogastroduodenoscopy, flexible,                         transoral; with transendoscopic balloon dilation of                         esophagus (less than 30 mm diameter) Diagnosis Code(s):     --- Professional ---                        K22.2, Esophageal obstruction  K44.9, Diaphragmatic hernia without obstruction or                         gangrene CPT copyright 2022 [employer]. All rights reserved. The codes documented in this report are preliminary and upon coder review may  be revised to meet current compliance requirements. Ole Schick MD, MD 05/25/2024 10:54:29 AM Number of Addenda: 0 Note Initiated On: 05/25/2024 10:37 AM Estimated Blood Loss:  Estimated blood loss was minimal.      [facility]

## 2024-08-24 ENCOUNTER — Other Ambulatory Visit: Payer: Self-pay | Admitting: Internal Medicine

## 2024-08-24 DIAGNOSIS — Z1231 Encounter for screening mammogram for malignant neoplasm of breast: Secondary | ICD-10-CM

## 2024-09-23 ENCOUNTER — Encounter

## 2024-10-01 ENCOUNTER — Ambulatory Visit
Admission: RE | Admit: 2024-10-01 | Discharge: 2024-10-01 | Disposition: A | Source: Ambulatory Visit | Attending: Internal Medicine | Admitting: Internal Medicine

## 2024-10-01 DIAGNOSIS — Z1231 Encounter for screening mammogram for malignant neoplasm of breast: Secondary | ICD-10-CM
